# Patient Record
Sex: Female | Born: 1980 | Race: Black or African American | Hispanic: No | Marital: Married | Smoking: Never smoker
Health system: Southern US, Community
[De-identification: ages and names within clinical notes are randomized; demographics above are authoritative.]

## PROBLEM LIST (undated history)

## (undated) ENCOUNTER — Inpatient Hospital Stay (HOSPITAL_COMMUNITY): Payer: Self-pay

## (undated) DIAGNOSIS — R87619 Unspecified abnormal cytological findings in specimens from cervix uteri: Secondary | ICD-10-CM

## (undated) DIAGNOSIS — I10 Essential (primary) hypertension: Secondary | ICD-10-CM

## (undated) DIAGNOSIS — G43909 Migraine, unspecified, not intractable, without status migrainosus: Secondary | ICD-10-CM

## (undated) DIAGNOSIS — A749 Chlamydial infection, unspecified: Secondary | ICD-10-CM

## (undated) DIAGNOSIS — D219 Benign neoplasm of connective and other soft tissue, unspecified: Secondary | ICD-10-CM

## (undated) DIAGNOSIS — IMO0002 Reserved for concepts with insufficient information to code with codable children: Secondary | ICD-10-CM

## (undated) HISTORY — PX: DILATION AND CURETTAGE OF UTERUS: SHX78

## (undated) HISTORY — PX: DILATION AND CURETTAGE, DIAGNOSTIC / THERAPEUTIC: SUR384

---
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
## 2001-02-03 ENCOUNTER — Emergency Department (HOSPITAL_COMMUNITY): Admission: EM | Admit: 2001-02-03 | Discharge: 2001-02-03 | Payer: Self-pay | Admitting: Emergency Medicine

## 2001-02-18 ENCOUNTER — Emergency Department (HOSPITAL_COMMUNITY): Admission: EM | Admit: 2001-02-18 | Discharge: 2001-02-18 | Payer: Self-pay | Admitting: Emergency Medicine

## 2002-01-21 ENCOUNTER — Emergency Department (HOSPITAL_COMMUNITY): Admission: EM | Admit: 2002-01-21 | Discharge: 2002-01-21 | Payer: Self-pay | Admitting: Emergency Medicine

## 2002-10-08 ENCOUNTER — Encounter: Payer: Self-pay | Admitting: Emergency Medicine

## 2002-10-08 ENCOUNTER — Emergency Department (HOSPITAL_COMMUNITY): Admission: EM | Admit: 2002-10-08 | Discharge: 2002-10-08 | Payer: Self-pay | Admitting: Emergency Medicine

## 2003-02-02 ENCOUNTER — Inpatient Hospital Stay (HOSPITAL_COMMUNITY): Admission: AD | Admit: 2003-02-02 | Discharge: 2003-02-03 | Payer: Self-pay | Admitting: Obstetrics & Gynecology

## 2003-03-19 ENCOUNTER — Emergency Department (HOSPITAL_COMMUNITY): Admission: EM | Admit: 2003-03-19 | Discharge: 2003-03-19 | Payer: Self-pay | Admitting: Family Medicine

## 2003-05-26 ENCOUNTER — Ambulatory Visit (HOSPITAL_COMMUNITY): Admission: RE | Admit: 2003-05-26 | Discharge: 2003-05-26 | Payer: Self-pay | Admitting: Obstetrics and Gynecology

## 2003-11-06 ENCOUNTER — Emergency Department (HOSPITAL_COMMUNITY): Admission: EM | Admit: 2003-11-06 | Discharge: 2003-11-06 | Payer: Self-pay | Admitting: Emergency Medicine

## 2003-11-17 ENCOUNTER — Emergency Department (HOSPITAL_COMMUNITY): Admission: EM | Admit: 2003-11-17 | Discharge: 2003-11-18 | Payer: Self-pay | Admitting: Emergency Medicine

## 2003-12-07 ENCOUNTER — Emergency Department (HOSPITAL_COMMUNITY): Admission: EM | Admit: 2003-12-07 | Discharge: 2003-12-07 | Payer: Self-pay | Admitting: Emergency Medicine

## 2004-03-31 ENCOUNTER — Emergency Department (HOSPITAL_COMMUNITY): Admission: EM | Admit: 2004-03-31 | Discharge: 2004-03-31 | Payer: Self-pay | Admitting: Family Medicine

## 2004-04-03 ENCOUNTER — Inpatient Hospital Stay (HOSPITAL_COMMUNITY): Admission: AD | Admit: 2004-04-03 | Discharge: 2004-04-03 | Payer: Self-pay | Admitting: *Deleted

## 2004-04-09 ENCOUNTER — Inpatient Hospital Stay (HOSPITAL_COMMUNITY): Admission: AD | Admit: 2004-04-09 | Discharge: 2004-04-09 | Payer: Self-pay | Admitting: Obstetrics and Gynecology

## 2004-04-19 ENCOUNTER — Emergency Department (HOSPITAL_COMMUNITY): Admission: EM | Admit: 2004-04-19 | Discharge: 2004-04-19 | Payer: Self-pay | Admitting: *Deleted

## 2004-06-24 ENCOUNTER — Ambulatory Visit (HOSPITAL_COMMUNITY): Admission: RE | Admit: 2004-06-24 | Discharge: 2004-06-24 | Payer: Self-pay | Admitting: Obstetrics and Gynecology

## 2004-07-27 ENCOUNTER — Inpatient Hospital Stay (HOSPITAL_COMMUNITY): Admission: AD | Admit: 2004-07-27 | Discharge: 2004-07-28 | Payer: Self-pay | Admitting: Family Medicine

## 2004-09-06 ENCOUNTER — Inpatient Hospital Stay (HOSPITAL_COMMUNITY): Admission: AD | Admit: 2004-09-06 | Discharge: 2004-09-06 | Payer: Self-pay | Admitting: *Deleted

## 2004-09-06 ENCOUNTER — Ambulatory Visit: Payer: Self-pay | Admitting: *Deleted

## 2004-10-30 ENCOUNTER — Ambulatory Visit: Payer: Self-pay | Admitting: *Deleted

## 2004-10-30 ENCOUNTER — Inpatient Hospital Stay (HOSPITAL_COMMUNITY): Admission: AD | Admit: 2004-10-30 | Discharge: 2004-10-30 | Payer: Self-pay | Admitting: Family Medicine

## 2005-02-22 ENCOUNTER — Inpatient Hospital Stay (HOSPITAL_COMMUNITY): Admission: AD | Admit: 2005-02-22 | Discharge: 2005-02-22 | Payer: Self-pay | Admitting: Obstetrics and Gynecology

## 2005-03-30 ENCOUNTER — Emergency Department (HOSPITAL_COMMUNITY): Admission: EM | Admit: 2005-03-30 | Discharge: 2005-03-30 | Payer: Self-pay | Admitting: Family Medicine

## 2005-05-19 ENCOUNTER — Emergency Department (HOSPITAL_COMMUNITY): Admission: EM | Admit: 2005-05-19 | Discharge: 2005-05-19 | Payer: Self-pay | Admitting: Family Medicine

## 2005-06-08 ENCOUNTER — Inpatient Hospital Stay (HOSPITAL_COMMUNITY): Admission: AD | Admit: 2005-06-08 | Discharge: 2005-06-09 | Payer: Self-pay | Admitting: Obstetrics & Gynecology

## 2005-07-03 ENCOUNTER — Emergency Department (HOSPITAL_COMMUNITY): Admission: EM | Admit: 2005-07-03 | Discharge: 2005-07-03 | Payer: Self-pay | Admitting: Family Medicine

## 2005-07-30 ENCOUNTER — Inpatient Hospital Stay (HOSPITAL_COMMUNITY): Admission: AD | Admit: 2005-07-30 | Discharge: 2005-07-30 | Payer: Self-pay | Admitting: Gynecology

## 2005-08-17 ENCOUNTER — Inpatient Hospital Stay (HOSPITAL_COMMUNITY): Admission: AD | Admit: 2005-08-17 | Discharge: 2005-08-18 | Payer: Self-pay | Admitting: Obstetrics & Gynecology

## 2005-09-16 ENCOUNTER — Inpatient Hospital Stay (HOSPITAL_COMMUNITY): Admission: AD | Admit: 2005-09-16 | Discharge: 2005-09-16 | Payer: Self-pay | Admitting: Obstetrics & Gynecology

## 2005-09-18 ENCOUNTER — Inpatient Hospital Stay (HOSPITAL_COMMUNITY): Admission: AD | Admit: 2005-09-18 | Discharge: 2005-09-18 | Payer: Self-pay | Admitting: Gynecology

## 2005-09-25 ENCOUNTER — Inpatient Hospital Stay (HOSPITAL_COMMUNITY): Admission: AD | Admit: 2005-09-25 | Discharge: 2005-09-25 | Payer: Self-pay | Admitting: Gynecology

## 2005-10-07 ENCOUNTER — Inpatient Hospital Stay (HOSPITAL_COMMUNITY): Admission: AD | Admit: 2005-10-07 | Discharge: 2005-10-07 | Payer: Self-pay | Admitting: Obstetrics & Gynecology

## 2005-11-22 ENCOUNTER — Inpatient Hospital Stay (HOSPITAL_COMMUNITY): Admission: AD | Admit: 2005-11-22 | Discharge: 2005-11-22 | Payer: Self-pay | Admitting: Gynecology

## 2005-12-04 ENCOUNTER — Inpatient Hospital Stay (HOSPITAL_COMMUNITY): Admission: AD | Admit: 2005-12-04 | Discharge: 2005-12-04 | Payer: Self-pay | Admitting: Obstetrics

## 2005-12-05 ENCOUNTER — Ambulatory Visit (HOSPITAL_COMMUNITY): Admission: RE | Admit: 2005-12-05 | Discharge: 2005-12-05 | Payer: Self-pay | Admitting: Obstetrics and Gynecology

## 2006-01-04 ENCOUNTER — Inpatient Hospital Stay (HOSPITAL_COMMUNITY): Admission: AD | Admit: 2006-01-04 | Discharge: 2006-01-04 | Payer: Self-pay | Admitting: Obstetrics

## 2006-01-31 ENCOUNTER — Inpatient Hospital Stay (HOSPITAL_COMMUNITY): Admission: AD | Admit: 2006-01-31 | Discharge: 2006-01-31 | Payer: Self-pay | Admitting: Obstetrics

## 2006-02-11 ENCOUNTER — Inpatient Hospital Stay (HOSPITAL_COMMUNITY): Admission: AD | Admit: 2006-02-11 | Discharge: 2006-02-11 | Payer: Self-pay | Admitting: Obstetrics

## 2006-02-11 ENCOUNTER — Inpatient Hospital Stay (HOSPITAL_COMMUNITY): Admission: AD | Admit: 2006-02-11 | Discharge: 2006-02-14 | Payer: Self-pay | Admitting: Obstetrics

## 2006-05-01 ENCOUNTER — Ambulatory Visit (HOSPITAL_COMMUNITY): Admission: RE | Admit: 2006-05-01 | Discharge: 2006-05-01 | Payer: Self-pay | Admitting: Obstetrics

## 2006-06-21 ENCOUNTER — Inpatient Hospital Stay (HOSPITAL_COMMUNITY): Admission: AD | Admit: 2006-06-21 | Discharge: 2006-06-21 | Payer: Self-pay | Admitting: Obstetrics

## 2006-07-04 ENCOUNTER — Emergency Department (HOSPITAL_COMMUNITY): Admission: EM | Admit: 2006-07-04 | Discharge: 2006-07-04 | Payer: Self-pay | Admitting: Family Medicine

## 2006-11-14 ENCOUNTER — Emergency Department (HOSPITAL_COMMUNITY): Admission: EM | Admit: 2006-11-14 | Discharge: 2006-11-14 | Payer: Self-pay | Admitting: Emergency Medicine

## 2006-11-14 ENCOUNTER — Emergency Department (HOSPITAL_COMMUNITY): Admission: EM | Admit: 2006-11-14 | Discharge: 2006-11-14 | Payer: Self-pay | Admitting: Family Medicine

## 2006-12-13 ENCOUNTER — Inpatient Hospital Stay (HOSPITAL_COMMUNITY): Admission: AD | Admit: 2006-12-13 | Discharge: 2006-12-13 | Payer: Self-pay | Admitting: Obstetrics

## 2007-02-04 ENCOUNTER — Emergency Department (HOSPITAL_COMMUNITY): Admission: EM | Admit: 2007-02-04 | Discharge: 2007-02-04 | Payer: Self-pay | Admitting: Family Medicine

## 2007-04-25 ENCOUNTER — Emergency Department (HOSPITAL_COMMUNITY): Admission: EM | Admit: 2007-04-25 | Discharge: 2007-04-25 | Payer: Self-pay | Admitting: Family Medicine

## 2007-06-12 ENCOUNTER — Inpatient Hospital Stay (HOSPITAL_COMMUNITY): Admission: AD | Admit: 2007-06-12 | Discharge: 2007-06-12 | Payer: Self-pay | Admitting: Obstetrics & Gynecology

## 2007-07-16 ENCOUNTER — Inpatient Hospital Stay (HOSPITAL_COMMUNITY): Admission: AD | Admit: 2007-07-16 | Discharge: 2007-07-16 | Payer: Self-pay | Admitting: Obstetrics & Gynecology

## 2007-07-26 ENCOUNTER — Ambulatory Visit (HOSPITAL_COMMUNITY): Admission: RE | Admit: 2007-07-26 | Discharge: 2007-07-26 | Payer: Self-pay | Admitting: Obstetrics & Gynecology

## 2007-09-21 ENCOUNTER — Inpatient Hospital Stay (HOSPITAL_COMMUNITY): Admission: AD | Admit: 2007-09-21 | Discharge: 2007-09-21 | Payer: Self-pay | Admitting: Obstetrics & Gynecology

## 2007-10-30 ENCOUNTER — Ambulatory Visit (HOSPITAL_COMMUNITY): Admission: RE | Admit: 2007-10-30 | Discharge: 2007-10-30 | Payer: Self-pay | Admitting: Obstetrics & Gynecology

## 2007-11-19 ENCOUNTER — Inpatient Hospital Stay (HOSPITAL_COMMUNITY): Admission: AD | Admit: 2007-11-19 | Discharge: 2007-11-19 | Payer: Self-pay | Admitting: Obstetrics

## 2007-12-17 ENCOUNTER — Inpatient Hospital Stay (HOSPITAL_COMMUNITY): Admission: AD | Admit: 2007-12-17 | Discharge: 2007-12-17 | Payer: Self-pay | Admitting: Obstetrics & Gynecology

## 2007-12-27 ENCOUNTER — Inpatient Hospital Stay (HOSPITAL_COMMUNITY): Admission: AD | Admit: 2007-12-27 | Discharge: 2007-12-28 | Payer: Self-pay | Admitting: Obstetrics & Gynecology

## 2007-12-29 ENCOUNTER — Inpatient Hospital Stay (HOSPITAL_COMMUNITY): Admission: AD | Admit: 2007-12-29 | Discharge: 2007-12-29 | Payer: Self-pay | Admitting: Obstetrics

## 2008-01-02 ENCOUNTER — Emergency Department (HOSPITAL_COMMUNITY): Admission: EM | Admit: 2008-01-02 | Discharge: 2008-01-02 | Payer: Self-pay | Admitting: Family Medicine

## 2008-01-03 ENCOUNTER — Inpatient Hospital Stay (HOSPITAL_COMMUNITY): Admission: AD | Admit: 2008-01-03 | Discharge: 2008-01-06 | Payer: Self-pay | Admitting: Obstetrics & Gynecology

## 2008-04-13 ENCOUNTER — Emergency Department (HOSPITAL_COMMUNITY): Admission: EM | Admit: 2008-04-13 | Discharge: 2008-04-13 | Payer: Self-pay | Admitting: Emergency Medicine

## 2008-07-02 ENCOUNTER — Emergency Department (HOSPITAL_COMMUNITY): Admission: EM | Admit: 2008-07-02 | Discharge: 2008-07-02 | Payer: Self-pay | Admitting: Emergency Medicine

## 2009-04-04 ENCOUNTER — Emergency Department (HOSPITAL_COMMUNITY): Admission: EM | Admit: 2009-04-04 | Discharge: 2009-04-04 | Payer: Self-pay | Admitting: Emergency Medicine

## 2009-05-27 ENCOUNTER — Emergency Department (HOSPITAL_COMMUNITY)
Admission: EM | Admit: 2009-05-27 | Discharge: 2009-05-27 | Payer: Self-pay | Source: Home / Self Care | Admitting: Emergency Medicine

## 2009-06-04 ENCOUNTER — Emergency Department (HOSPITAL_COMMUNITY)
Admission: EM | Admit: 2009-06-04 | Discharge: 2009-06-04 | Payer: Self-pay | Source: Home / Self Care | Admitting: Emergency Medicine

## 2009-11-01 ENCOUNTER — Emergency Department (HOSPITAL_COMMUNITY): Admission: EM | Admit: 2009-11-01 | Discharge: 2009-11-01 | Payer: Self-pay | Admitting: Emergency Medicine

## 2009-12-21 ENCOUNTER — Inpatient Hospital Stay (HOSPITAL_COMMUNITY): Admission: AD | Admit: 2009-12-21 | Discharge: 2009-12-21 | Payer: Self-pay | Admitting: Obstetrics & Gynecology

## 2010-01-26 LAB — HEPATITIS B SURFACE ANTIGEN: Hepatitis B Surface Ag: NEGATIVE

## 2010-01-26 LAB — RUBELLA ANTIBODY, IGM: Rubella: IMMUNE

## 2010-01-26 LAB — ABO/RH: RH Type: POSITIVE

## 2010-01-30 NOTE — L&D Delivery Note (Signed)
Delivery Note  At 2:39 PM a viable female Savannah Thomas) was delivered via Vaginal, Spontaneous Delivery (Presentation:OA ; ). APGAR:7,9 ; weight 7 lb 11.1 oz (3490 g).  Placenta status: Intact, Spontaneous. Cord: 3 vessels with the following complications: None.   A tight nuchal cord was cut while the fetal head was on the perineum. The infant was given to the Peds team. Cord blood was collected in a sterile fashion for the Cord Blood Registry. The placenta was sent to pathology. No perineal or vaginal lacerations were noted.  Anesthesia: Epidural  Episiotomy: None  Lacerations: None  Est. Blood Loss (mL): 350  Mom to postpartum. Baby to nursery-stable.  Janine Limbo  08/19/2010, 3:23 PM

## 2010-02-16 LAB — STREP B DNA PROBE: GBS: POSITIVE

## 2010-02-20 ENCOUNTER — Encounter: Payer: Self-pay | Admitting: Obstetrics

## 2010-03-16 ENCOUNTER — Encounter (HOSPITAL_COMMUNITY): Payer: Self-pay | Admitting: Radiology

## 2010-03-16 ENCOUNTER — Inpatient Hospital Stay (HOSPITAL_COMMUNITY): Payer: BC Managed Care – PPO

## 2010-03-16 ENCOUNTER — Inpatient Hospital Stay (HOSPITAL_COMMUNITY)
Admission: AD | Admit: 2010-03-16 | Discharge: 2010-03-16 | Disposition: A | Discharge status: Home / Self Care | Payer: BC Managed Care – PPO | Source: Ambulatory Visit | Attending: Obstetrics and Gynecology | Admitting: Obstetrics and Gynecology

## 2010-03-16 DIAGNOSIS — O99891 Other specified diseases and conditions complicating pregnancy: Secondary | ICD-10-CM | POA: Insufficient documentation

## 2010-03-16 DIAGNOSIS — R51 Headache: Secondary | ICD-10-CM | POA: Insufficient documentation

## 2010-03-16 DIAGNOSIS — R296 Repeated falls: Secondary | ICD-10-CM | POA: Insufficient documentation

## 2010-03-16 DIAGNOSIS — R42 Dizziness and giddiness: Secondary | ICD-10-CM | POA: Insufficient documentation

## 2010-03-16 LAB — WET PREP, GENITAL
Clue Cells Wet Prep HPF POC: NONE SEEN
Yeast Wet Prep HPF POC: NONE SEEN

## 2010-04-12 LAB — CBC
HCT: 34.3 % — ABNORMAL LOW (ref 36.0–46.0)
Hemoglobin: 11.2 g/dL — ABNORMAL LOW (ref 12.0–15.0)
MCH: 26.6 pg (ref 26.0–34.0)
MCHC: 32.7 g/dL (ref 30.0–36.0)

## 2010-04-12 LAB — URINALYSIS, ROUTINE W REFLEX MICROSCOPIC
Bilirubin Urine: NEGATIVE
Ketones, ur: NEGATIVE mg/dL
Nitrite: NEGATIVE
Specific Gravity, Urine: 1.02 (ref 1.005–1.030)

## 2010-04-12 LAB — HCG, QUANTITATIVE, PREGNANCY: hCG, Beta Chain, Quant, S: 5417 m[IU]/mL — ABNORMAL HIGH (ref <5)

## 2010-04-12 LAB — WET PREP, GENITAL: Trich, Wet Prep: NONE SEEN

## 2010-04-12 LAB — GC/CHLAMYDIA PROBE AMP, GENITAL: Chlamydia, DNA Probe: NEGATIVE

## 2010-04-19 LAB — POCT URINALYSIS DIP (DEVICE)
Hgb urine dipstick: NEGATIVE
Nitrite: NEGATIVE
Protein, ur: NEGATIVE mg/dL
Urobilinogen, UA: 0.2 mg/dL (ref 0.0–1.0)
pH: 7 (ref 5.0–8.0)

## 2010-04-19 LAB — POCT I-STAT, CHEM 8
BUN: 10 mg/dL (ref 6–23)
Calcium, Ion: 1.14 mmol/L (ref 1.12–1.32)
Creatinine, Ser: 0.8 mg/dL (ref 0.4–1.2)
Glucose, Bld: 91 mg/dL (ref 70–99)
HCT: 41 % (ref 36.0–46.0)
Hemoglobin: 13.9 g/dL (ref 12.0–15.0)
Sodium: 138 mEq/L (ref 135–145)

## 2010-06-14 NOTE — H&P (Signed)
NAME:  Savannah Thomas, Savannah Thomas NO.:  [id number]   MEDICAL RECORD NO.:  [id number]          PATIENT TYPE:  INP   LOCATION:  9166                          FACILITY:  [facility]   PHYSICIAN:  Roseanna Rainbow, M.D.DATE OF BIRTH:  02-12-1980   DATE OF ADMISSION:  01/03/2008  DATE OF DISCHARGE:                              HISTORY & PHYSICAL   CHIEF COMPLAINT:  The patient is a 30 year old para 2 with an estimated  date of confinement of January 06, 2008, with an intrauterine pregnancy  at 39+ weeks complaining of contractions.   HISTORY OF PRESENT ILLNESS:  Please see the above.   ALLERGIES:  No known drug allergies.   MEDICATIONS:  Prenatal vitamins.   OBSTETRICAL RISK FACTORS:  She has a history of a preterm delivery and a  previous cesarean section; however, she has had a VBAC.   PRENATAL LABORATORY DATA:  Urine culture and sensitivity insignificant  growth.  Hepatitis B surface antigen negative.  Hematocrit 33.2,  hemoglobin 10.4.  HIV nonreactive.  Quad screen negative.  Platelets  261,000.  Blood type is measured A+.  Antibody screen negative.  RPR  nonreactive.  Rubella immune.  Sickle cell negative.   PAST OBSTETRICAL HISTORY:  There is a history of a spontaneous abortion.  In October 2006, she was delivered of a live born female full term, 8  pounds 15 ounces via cesarean delivery.  In January 2008, she was  delivered of a live born female at 35 weeks, 5 pounds 5 ounces, vaginal  delivery.   PAST GYNECOLOGICAL HISTORY:  Noncontributory.   PAST MEDICAL HISTORY:  1. Asthma.  2. Tension headaches.   PAST SURGICAL HISTORY:  She denies.   SOCIAL HISTORY:  She is a homemaker, single, living with the significant  other.  Has no significant smoking history.   FAMILY HISTORY:  Remarkable for adult-onset diabetes and hypertension.   PHYSICAL EXAM:  Vital signs stable, afebrile.  Fetal heart tracing  reassuring.  Tocodynamometer, uterine contractions every 2-4  minutes.  Sterile vaginal exam per the RN: Cervix is 5 cm dilated.   ASSESSMENT:  1. Multipara at term.  2. History of a previous cesarean delivery and successful vaginal      birth after cesarean.  3. Late latent versus early active labor.  4. Fetal heart tracing consistent with fetal well being.   PLAN:  Admission.  Expectant management.      Roseanna Rainbow, M.D.  Electronically Signed     LAJ/MEDQ  D:  01/04/2008  T:  01/04/2008  Job:  [id number]

## 2010-08-08 ENCOUNTER — Inpatient Hospital Stay (HOSPITAL_COMMUNITY)
Admission: AD | Admit: 2010-08-08 | Discharge: 2010-08-08 | Disposition: A | Discharge status: Home / Self Care | Payer: Medicaid Other | Source: Ambulatory Visit | Attending: Obstetrics and Gynecology | Admitting: Obstetrics and Gynecology

## 2010-08-08 ENCOUNTER — Encounter (HOSPITAL_COMMUNITY): Payer: Self-pay

## 2010-08-08 ENCOUNTER — Inpatient Hospital Stay (HOSPITAL_COMMUNITY)
Admission: AD | Admit: 2010-08-08 | Payer: BC Managed Care – PPO | Source: Ambulatory Visit | Admitting: Obstetrics and Gynecology

## 2010-08-08 DIAGNOSIS — O133 Gestational [pregnancy-induced] hypertension without significant proteinuria, third trimester: Secondary | ICD-10-CM

## 2010-08-08 DIAGNOSIS — Z3490 Encounter for supervision of normal pregnancy, unspecified, unspecified trimester: Secondary | ICD-10-CM

## 2010-08-08 DIAGNOSIS — O1200 Gestational edema, unspecified trimester: Secondary | ICD-10-CM | POA: Diagnosis present

## 2010-08-08 DIAGNOSIS — O139 Gestational [pregnancy-induced] hypertension without significant proteinuria, unspecified trimester: Secondary | ICD-10-CM | POA: Insufficient documentation

## 2010-08-08 HISTORY — DX: Chlamydial infection, unspecified: A74.9

## 2010-08-08 HISTORY — DX: Benign neoplasm of connective and other soft tissue, unspecified: D21.9

## 2010-08-08 HISTORY — DX: Migraine, unspecified, not intractable, without status migrainosus: G43.909

## 2010-08-08 HISTORY — DX: Unspecified abnormal cytological findings in specimens from cervix uteri: R87.619

## 2010-08-08 HISTORY — DX: Reserved for concepts with insufficient information to code with codable children: IMO0002

## 2010-08-08 LAB — COMPREHENSIVE METABOLIC PANEL
ALT: 6 U/L (ref 0–35)
AST: 19 U/L (ref 0–37)
Alkaline Phosphatase: 114 U/L (ref 39–117)
CO2: 22 mEq/L (ref 19–32)
Chloride: 105 mEq/L (ref 96–112)
Creatinine, Ser: 0.48 mg/dL — ABNORMAL LOW (ref 0.50–1.10)
GFR calc non Af Amer: >60 mL/min (ref >60)
Total Bilirubin: 0.1 mg/dL — ABNORMAL LOW (ref 0.3–1.2)

## 2010-08-08 LAB — CBC
MCV: 78.7 fL (ref 78.0–100.0)
Platelets: 205 10*3/uL (ref 150–400)
RBC: 4.09 MIL/uL (ref 3.87–5.11)
WBC: 9.9 10*3/uL (ref 4.0–10.5)

## 2010-08-08 LAB — URINALYSIS, ROUTINE W REFLEX MICROSCOPIC
Bilirubin Urine: NEGATIVE
Glucose, UA: 100 mg/dL — AB
Hgb urine dipstick: NEGATIVE
Ketones, ur: NEGATIVE mg/dL
pH: 6.5 (ref 5.0–8.0)

## 2010-08-08 LAB — URIC ACID: Uric Acid, Serum: 4.9 mg/dL (ref 2.4–7.0)

## 2010-08-08 MED ORDER — ACETAMINOPHEN 325 MG PO TABS
650.0000 mg | ORAL_TABLET | Freq: Once | ORAL | Status: AC
  Administered 2010-08-08: 650 mg via ORAL

## 2010-08-08 NOTE — ED Provider Notes (Signed)
History     Chief Complaint  Patient presents with  . Leg Swelling    Other This is a new problem. The current episode started in the past 7 days. The problem occurs constantly. The problem has been unchanged. Associated symptoms include headaches (Resolved spontaneously). Pertinent negatives include no abdominal pain (Denies contractions, vaginal bleeding or leaking of fluid). Visual change: blurry vision since this morning. The symptoms are aggravated by nothing. She has tried nothing for the symptoms.   The patient is a 30 year-old black female at 37.[redacted] weeks gestation.   OB History    Grav Para Term Preterm Abortions TAB SAB Ect Mult Living   7 3 2 1 3 2 1   3       Past Medical History  Diagnosis Date  . Gestational diabetes   . Abnormal Pap smear   . Preterm labor   . Fibroids   . Chlamydia   . Candidiasis   . Asthma   . Migraines     Past Surgical History  Procedure Date  . Dilation and curettage, diagnostic / therapeutic     TAB  . Dilation and curettage of uterus   . Cesarean section     Family History  Problem Relation Age of Onset  . Hypertension Mother   . Hypertension Father   . Cancer Maternal Grandmother   . Hypertension Maternal Grandmother   . Hypertension Maternal Grandfather   . Hypertension Paternal Grandmother   . Heart disease Paternal Grandfather   . Hypertension Paternal Grandfather   . Heart disease Cousin     History  Substance Use Topics  . Smoking status: Never Smoker   . Smokeless tobacco: Not on file  . Alcohol Use: No    Allergies: No Known Allergies  No prescriptions prior to admission    Review of Systems  Constitutional: Negative.   Eyes: Positive for blurred vision.  Cardiovascular: Positive for leg swelling.  Gastrointestinal: Negative for abdominal pain (Denies contractions, vaginal bleeding or leaking of fluid).       Denies epigastric pain.  Neurological: Positive for headaches (Resolved spontaneously).    Physical Exam   Blood pressure 116/81, pulse 88, temperature 98.9 F (37.2 C), temperature source Oral, resp. rate 18, height 5\' 6"  (1.676 m), weight 133.902 kg (295 lb 3.2 oz), SpO2 95.00%.  BP's: 115-144/68-91 (147/95) SVE: 0/50/-3 FHR: 140's , category 1 Toco: irreg, mild Results for orders placed during the hospital encounter of 08/08/10 (from the past 24 hour(s))  CBC     Status: Abnormal   Collection Time   08/08/10  6:18 PM      Component Value Range   WBC 9.9  4.0 - 10.5 (K/uL)   RBC 4.09  3.87 - 5.11 (MIL/uL)   Hemoglobin 10.5 (*) 12.0 - 15.0 (g/dL)   HCT 16.1 (*) 09.6 - 46.0 (%)   MCV 78.7  78.0 - 100.0 (fL)   MCH 25.7 (*) 26.0 - 34.0 (pg)   MCHC 32.6  30.0 - 36.0 (g/dL)   RDW 04.5  40.9 - 81.1 (%)   Platelets 205  150 - 400 (K/uL)      *Note: This is not all of the results for the requested time period. They were limited due to a high amount of results. Please view the rest in Results Review.   AST: 19 ALT: 6 Uric Acid: 4.9 LDH: 171 Urine: neg protein     Physical Exam  Constitutional: She appears well-developed and well-nourished.  GI:  Soft. There is no tenderness.  Genitourinary: No bleeding around the vagina.       No leaking of fluid.  Neurological: She has normal reflexes.    MAU Course  Procedures  Assessment:  1. Gestational Hypertension 2. FHR category 1  Plan: 1. D/C home 2. 24-hour urine supplies and instructions given. Bring to next appointment 08/10/10 at [facility] 3. NST per consult with Dr. Normand Sloop. 4. PIH and labor precautions. FKCs.

## 2010-08-08 NOTE — Progress Notes (Signed)
Pt states she started having increased swelling in feet 7-7, this am started having a headache and visual changes. Some irreg mild contractions

## 2010-08-08 NOTE — Initial Assessments (Signed)
Patient is here with c/o bilateral leg swelling, headache. She denies any visual disturbance, vaginal bleeding, lof or discharge. She reports good fetal movement. 2+ edema in her lower extremities.

## 2010-08-16 ENCOUNTER — Encounter (HOSPITAL_COMMUNITY): Payer: Self-pay | Admitting: *Deleted

## 2010-08-16 ENCOUNTER — Inpatient Hospital Stay (HOSPITAL_COMMUNITY)
Admission: AD | Admit: 2010-08-16 | Discharge: 2010-08-16 | Disposition: A | Discharge status: Home / Self Care | Payer: BC Managed Care – PPO | Source: Ambulatory Visit | Attending: Obstetrics and Gynecology | Admitting: Obstetrics and Gynecology

## 2010-08-16 ENCOUNTER — Encounter: Payer: Self-pay | Admitting: Obstetrics and Gynecology

## 2010-08-16 DIAGNOSIS — O139 Gestational [pregnancy-induced] hypertension without significant proteinuria, unspecified trimester: Secondary | ICD-10-CM

## 2010-08-16 DIAGNOSIS — Z3490 Encounter for supervision of normal pregnancy, unspecified, unspecified trimester: Secondary | ICD-10-CM

## 2010-08-16 LAB — COMPREHENSIVE METABOLIC PANEL
ALT: 7 U/L (ref 0–35)
AST: 20 U/L (ref 0–37)
Calcium: 9.1 mg/dL (ref 8.4–10.5)
Creatinine, Ser: 0.49 mg/dL — ABNORMAL LOW (ref 0.50–1.10)
GFR calc Af Amer: >60 mL/min (ref >60)
Glucose, Bld: 75 mg/dL (ref 70–99)
Sodium: 136 mEq/L (ref 135–145)
Total Protein: 7.1 g/dL (ref 6.0–8.3)

## 2010-08-16 LAB — URINALYSIS, DIPSTICK ONLY
Bilirubin Urine: NEGATIVE
Nitrite: NEGATIVE
Specific Gravity, Urine: 1.015 (ref 1.005–1.030)
Urobilinogen, UA: 0.2 mg/dL (ref 0.0–1.0)
pH: 6.5 (ref 5.0–8.0)

## 2010-08-16 LAB — URIC ACID: Uric Acid, Serum: 5 mg/dL (ref 2.4–7.0)

## 2010-08-16 LAB — CBC
MCH: 25.1 pg — ABNORMAL LOW (ref 26.0–34.0)
MCHC: 31.8 g/dL (ref 30.0–36.0)
MCV: 78.7 fL (ref 78.0–100.0)
Platelets: 227 10*3/uL (ref 150–400)

## 2010-08-16 MED ORDER — LABETALOL HCL 100 MG PO TABS
400.0000 mg | ORAL_TABLET | Freq: Once | ORAL | Status: AC
  Administered 2010-08-16: 400 mg via ORAL
  Filled 2010-08-16: qty 4

## 2010-08-16 MED ORDER — LABETALOL HCL 200 MG PO TABS: 400.0000 mg | ORAL_TABLET | Freq: Two times a day (BID) | ORAL | Status: DC

## 2010-08-16 MED ORDER — LABETALOL HCL 200 MG PO TABS: 400.0000 mg | ORAL_TABLET | Freq: Once | ORAL | Status: DC

## 2010-08-16 NOTE — ED Notes (Signed)
Savannah Thomas CNM in with pt. Cervix checked.

## 2010-08-16 NOTE — ED Notes (Signed)
Ice chips given

## 2010-08-16 NOTE — Progress Notes (Signed)
Pt reports having elevated BP 150/90 at office today, and was sent over for [facility] workup."

## 2010-08-16 NOTE — Progress Notes (Signed)
A user error has taken place: encounter opened in error, closed for administrative reasons.            This encounter was created in error - please disregard.

## 2010-08-16 NOTE — ED Provider Notes (Signed)
History   Patient is a 30 yo [id number] at 70 5/7 weeks who presented for evaluation due to BP 160s/90 in office today.  Had PIH w/u 2 weeks ago with normal labs, and a 24 hour urine of 105 last week.  BP has been mildly elevated over last 2 weeks. Patient denies HA, visual symptoms, or epigastric pain.  Has had mild swelling.  No history of elevated BP in previous pregnancies or between, and this pregnancy is with same partner.  Chief Complaint  Patient presents with  . Hypertension   Hypertension      Past Medical History  Diagnosis Date  . Abnormal Pap smear   . Preterm labor   . Fibroids   . Chlamydia   . Candidiasis   . Asthma   . Migraines   . Gestational diabetes     Past Surgical History  Procedure Date  . Dilation and curettage, diagnostic / therapeutic     TAB  . Dilation and curettage of uterus   . Cesarean section   . No past surgeries     Family History  Problem Relation Age of Onset  . Hypertension Mother   . Hypertension Father   . Cancer Maternal Grandmother   . Hypertension Maternal Grandmother   . Hypertension Maternal Grandfather   . Hypertension Paternal Grandmother   . Heart disease Paternal Grandfather   . Hypertension Paternal Grandfather   . Heart disease Cousin     History  Substance Use Topics  . Smoking status: Never Smoker   . Smokeless tobacco: Not on file  . Alcohol Use: No    Allergies: No Known Allergies  Prescriptions prior to admission  Medication Sig Dispense Refill  . prenatal vitamin w/FE, FA (PRENATAL 1 + 1) 27-1 MG TABS Take 1 tablet by mouth daily.          Review of Systems  Constitutional: Negative.   HENT: Negative.   Eyes: Negative.   Respiratory: Negative.   Cardiovascular: Negative.   Gastrointestinal: Negative.   Genitourinary: Negative.   Musculoskeletal: Negative.   Skin: Negative.   Neurological: Negative.   Endo/Heme/Allergies: Negative.   Psychiatric/Behavioral: Negative.    Physical Exam    Blood pressure 145/90, pulse 69, temperature 99.1 F (37.3 C), temperature source Oral, height 5\' 7"  (1.702 m), weight 133.358 kg (294 lb), SpO2 99.00%. BP initially 164/96, then 137-145/87-89 during MAU stay.   FHR reactive, U/C q4-35min, mild.  Cervix closed, 50%, vertex -2, but ballotable.  Physical Exam  Constitutional: She appears well-developed and well-nourished.  HENT:  Head: Normocephalic and atraumatic.  Eyes: Pupils are equal, round, and reactive to light.  Neck: Normal range of motion.  Cardiovascular: Normal rate.   Respiratory: Effort normal.  GI: Soft.  Genitourinary: Vagina normal.  Musculoskeletal: Edema: 1-2+  Neurological: She has normal reflexes.  Skin: Skin is warm.  Psychiatric: She has a normal mood and affect.      Results for orders placed during the hospital encounter of 08/16/10 (from the past 24 hour(s))  URINALYSIS, DIPSTICK ONLY     Status: Abnormal   Collection Time   08/16/10  5:00 PM      Component Value Range   Specific Gravity, Urine 1.015  1.005 - 1.030    pH 6.5  5.0 - 8.0    Glucose, UA NEGATIVE  NEGATIVE (mg/dL)   Hgb urine dipstick NEGATIVE  NEGATIVE    Bilirubin Urine NEGATIVE  NEGATIVE    Ketones, ur 15 (*) NEGATIVE (  mg/dL)   Protein, ur NEGATIVE  NEGATIVE (mg/dL)   Urobilinogen, UA 0.2  0.0 - 1.0 (mg/dL)   Nitrite NEGATIVE  NEGATIVE    Leukocytes, UA NEGATIVE  NEGATIVE   CBC     Status: Abnormal   Collection Time   08/16/10  5:42 PM      Component Value Range   WBC 10.1  4.0 - 10.5 (K/uL)   RBC 4.23  3.87 - 5.11 (MIL/uL)   Hemoglobin 10.6 (*) 12.0 - 15.0 (g/dL)   HCT 14.7 (*) 82.9 - 46.0 (%)   MCV 78.7  78.0 - 100.0 (fL)   MCH 25.1 (*) 26.0 - 34.0 (pg)   MCHC 31.8  30.0 - 36.0 (g/dL)   RDW 56.2  13.0 - 86.5 (%)   Platelets 227  150 - 400 (K/uL)  COMPREHENSIVE METABOLIC PANEL     Status: Abnormal   Collection Time   08/16/10  5:42 PM      Component Value Range   Sodium 136  135 - 145 (mEq/L)   Potassium 3.9  3.5 - 5.1  (mEq/L)   Chloride 104  96 - 112 (mEq/L)   CO2 21  19 - 32 (mEq/L)   Glucose, Bld 75  70 - 99 (mg/dL)   BUN 5 (*) 6 - 23 (mg/dL)   Creatinine, Ser 7.84 (*) 0.50 - 1.10 (mg/dL)   Calcium 9.1  8.4 - 69.6 (mg/dL)   Total Protein 7.1  6.0 - 8.3 (g/dL)   Albumin 2.4 (*) 3.5 - 5.2 (g/dL)   AST 20  0 - 37 (U/L)   ALT 7  0 - 35 (U/L)   Alkaline Phosphatase 141 (*) 39 - 117 (U/L)   Total Bilirubin 0.2 (*) 0.3 - 1.2 (mg/dL)   GFR calc non Af Amer >60  >60 (mL/min)   GFR calc Af Amer >60  >60 (mL/min)  LACTATE DEHYDROGENASE     Status: Abnormal   Collection Time   08/16/10  5:42 PM      Component Value Range   LD 405 (*) 94 - 250 (U/L)  URIC ACID     Status: Normal   Collection Time   08/16/10  5:42 PM      Component Value Range   Uric Acid, Serum 5.0  2.4 - 7.0 (mg/dL)   Labs WNL x elevated LDH.   MAU Course  Procedures   Assessment and Plan  Reviewed with Dr. Stefano Gaul labetolol 400 mg po now and BID (Rx given) PIH precautions reviewed Patient to return to office on Thursday, 7/19, for BP check. Increase rest at home.  Jahzion Brogden L 08/16/2010, 7:09 PM

## 2010-08-16 NOTE — Progress Notes (Signed)
Pt sent from the office for evaluation of elevated BP. Pt denies pain, bleeding or leaking fluid. No headache or visual changes. Reports good fetal movement.

## 2010-08-17 NOTE — ED Provider Notes (Signed)
  Medical screening examination/treatment/procedure(s) were performed by non-physician practitioner and as supervising physician I was immediately available for consultation/collaboration.     

## 2010-08-18 NOTE — H&P (Signed)
NAME:  Savannah Thomas, Savannah Thomas NO.:  [id number]  MEDICAL RECORD NO.:  [id number]  LOCATION:  GN56                          FACILITY:  [facility]  PHYSICIAN:  Janine Limbo, M.D.DATE OF BIRTH:  1980/05/13  DATE OF ADMISSION:  08/16/2010 DATE OF DISCHARGE:  08/16/2010                             HISTORY & PHYSICAL   HISTORY OF PRESENT ILLNESS:  The patient is a 30 year old female, gravida 7, para 2-1-3-3, who presents at 39 weeks and 1 day gestation (EDC is August 25, 2010).  The patient has been followed at the [facility].  The pregnancy has been complicated by the fact that her weight is 299 pounds.  She has had a prior cesarean section.  She desires a repeat cesarean section.  She has had 2 vaginal deliveries after cesarean delivery.  The patient has pregnancy-induced hypertension.  She is currently being treated with labetalol 200 mg twice each day.  Her third trimester beta strep test was positive.  The patient also has a known history of fibroids.  She has a history of a preterm delivery.  OBSTETRICAL HISTORY:  The patient had a miscarriage in 2004.  She had a cesarean section in 2006 for failure to progress in labor.  She delivered an 8 pound 15 ounce female infant at that time.  In 2008 and again in 2009, the patient had vaginal deliveries.  In 2008 and 2011, she had an elective pregnancy termination.  DRUG ALLERGIES:  No known drug allergies.  PAST MEDICAL HISTORY:  The patient has a history of gestational diabetes with her third child.  The patient had a cesarean section in 2006.  SOCIAL HISTORY:  The patient denies cigarette use, alcohol use, and recreational drug use.  FAMILY HISTORY:  Noncontributory.  PHYSICAL EXAMINATION:  VITAL SIGNS:  Weight is 299 pounds.  Height is 5 feet 7 inches. HEENT:  Within normal limits. CHEST:  Clear. HEART:  Regular rate and rhythm. BREASTS:  Without  masses. ABDOMEN:  Gravid with fundal height of 41 cm. EXTREMITIES:  Grossly normal. NEUROLOGIC:  Grossly normal. PELVIC:  The cervix is 3 cm dilated, 80% effaced, and -1 in station.  LABORATORY VALUES:  Blood type is A+, antibody screen negative, sickle cell negative, VDRL nonreactive, rubella immune, HBsAg negative, HIV nonreactive, GC negative, Chlamydia negative.  Third trimester beta strep is negative, but the patient did have beta strep bacteriuria during her pregnancy.  ASSESSMENT: 1. A 39-week and 1-day gestation. 2. Obesity (weight 299 pounds). 3. Pregnancy-induced hypertension. 4. Favorable cervix. 5. Fibroid uterus. 6. History of beta strep bacteriuria 7. Prior cesarean section and 2 subsequent vaginal birth after     cesarean section.  PLAN:  The patient will be admitted for induction of labor.  We will start with Pitocin.  We will follow with amniotomy.  The patient understands the indications for her procedure and she accepts the risks. We will treat the patient during her labor with penicillin.     Janine Limbo, M.D.     AVS/MEDQ  D:  08/18/2010  T:  08/18/2010  Job:  [id number]

## 2010-08-19 ENCOUNTER — Encounter (HOSPITAL_COMMUNITY): Payer: Self-pay | Admitting: Anesthesiology

## 2010-08-19 ENCOUNTER — Inpatient Hospital Stay (HOSPITAL_COMMUNITY)
Admission: RE | Admit: 2010-08-19 | Discharge: 2010-08-21 | DRG: 775 | Disposition: A | Discharge status: Home / Self Care | Payer: Medicaid Other | Source: Ambulatory Visit | Attending: Obstetrics and Gynecology | Admitting: Obstetrics and Gynecology

## 2010-08-19 ENCOUNTER — Encounter (HOSPITAL_COMMUNITY): Payer: Self-pay

## 2010-08-19 ENCOUNTER — Other Ambulatory Visit: Payer: Self-pay | Admitting: Obstetrics and Gynecology

## 2010-08-19 ENCOUNTER — Inpatient Hospital Stay (HOSPITAL_COMMUNITY): Payer: Medicaid Other | Admitting: Anesthesiology

## 2010-08-19 DIAGNOSIS — O1200 Gestational edema, unspecified trimester: Secondary | ICD-10-CM

## 2010-08-19 DIAGNOSIS — O139 Gestational [pregnancy-induced] hypertension without significant proteinuria, unspecified trimester: Principal | ICD-10-CM | POA: Diagnosis present

## 2010-08-19 LAB — CBC
HCT: 31.2 % — ABNORMAL LOW (ref 36.0–46.0)
Hemoglobin: 10.1 g/dL — ABNORMAL LOW (ref 12.0–15.0)
MCHC: 32.4 g/dL (ref 30.0–36.0)
MCV: 79 fL (ref 78.0–100.0)

## 2010-08-19 MED ORDER — LANOLIN HYDROUS EX OINT: TOPICAL_OINTMENT | CUTANEOUS | Status: DC | PRN

## 2010-08-19 MED ORDER — OXYTOCIN 20 UNITS IN LACTATED RINGERS INFUSION - SIMPLE: 125.0000 mL/h | INTRAVENOUS | Status: DC | PRN

## 2010-08-19 MED ORDER — SIMETHICONE 80 MG PO CHEW: 80.0000 mg | CHEWABLE_TABLET | ORAL | Status: DC | PRN

## 2010-08-19 MED ORDER — BENZOCAINE-MENTHOL 20-0.5 % EX AERO: 1.0000 "application " | INHALATION_SPRAY | CUTANEOUS | Status: DC | PRN

## 2010-08-19 MED ORDER — OXYTOCIN 20 UNITS IN LACTATED RINGERS INFUSION - SIMPLE: 2.0000 m[IU]/min | INTRAVENOUS | Status: DC

## 2010-08-19 MED ORDER — OXYTOCIN 20 UNITS IN LACTATED RINGERS INFUSION - SIMPLE: 125.0000 mL/h | Freq: Once | INTRAVENOUS | Status: DC

## 2010-08-19 MED ORDER — LABETALOL HCL 5 MG/ML IV SOLN: 10.0000 mg/min | INTRAVENOUS | Status: DC | PRN

## 2010-08-19 MED ORDER — ONDANSETRON HCL 4 MG/2ML IJ SOLN: 4.0000 mg | INTRAMUSCULAR | Status: DC | PRN

## 2010-08-19 MED ORDER — SENNOSIDES-DOCUSATE SODIUM 8.6-50 MG PO TABS
1.0000 | ORAL_TABLET | Freq: Every day | ORAL | Status: DC
  Administered 2010-08-19: 2 via ORAL

## 2010-08-19 MED ORDER — ONDANSETRON HCL 4 MG PO TABS
4.0000 mg | ORAL_TABLET | ORAL | Status: DC | PRN
  Administered 2010-08-19: 4 mg via ORAL
  Filled 2010-08-19: qty 1

## 2010-08-19 MED ORDER — IBUPROFEN 600 MG PO TABS: 600.0000 mg | ORAL_TABLET | Freq: Four times a day (QID) | ORAL | Status: DC | PRN

## 2010-08-19 MED ORDER — FENTANYL CITRATE 0.05 MG/ML IJ SOLN
100.0000 ug | INTRAMUSCULAR | Status: DC | PRN
  Administered 2010-08-19: 100 ug via INTRAVENOUS
  Filled 2010-08-19: qty 2

## 2010-08-19 MED ORDER — CITRIC ACID-SODIUM CITRATE 334-500 MG/5ML PO SOLN
30.0000 mL | ORAL | Status: DC | PRN
  Filled 2010-08-19: qty 15

## 2010-08-19 MED ORDER — OXYTOCIN 20 UNITS IN LACTATED RINGERS INFUSION - SIMPLE: 1.0000 m[IU]/min | INTRAVENOUS | Status: DC

## 2010-08-19 MED ORDER — PRENATAL PLUS 27-1 MG PO TABS
1.0000 | ORAL_TABLET | Freq: Every day | ORAL | Status: DC
  Administered 2010-08-20 – 2010-08-21 (×2): 1 via ORAL
  Filled 2010-08-19 (×2): qty 1

## 2010-08-19 MED ORDER — PHENYLEPHRINE 40 MCG/ML (10ML) SYRINGE FOR IV PUSH (FOR BLOOD PRESSURE SUPPORT)
80.0000 ug | PREFILLED_SYRINGE | INTRAVENOUS | Status: DC | PRN
  Filled 2010-08-19: qty 5

## 2010-08-19 MED ORDER — DIPHENHYDRAMINE HCL 50 MG/ML IJ SOLN: 12.5000 mg | INTRAMUSCULAR | Status: DC | PRN

## 2010-08-19 MED ORDER — LACTATED RINGERS IR SOLN: 200.0000 mL/h | Status: DC

## 2010-08-19 MED ORDER — OXYCODONE-ACETAMINOPHEN 5-325 MG PO TABS
1.0000 | ORAL_TABLET | ORAL | Status: DC | PRN
  Administered 2010-08-20: 1 via ORAL
  Filled 2010-08-19: qty 1

## 2010-08-19 MED ORDER — ACETAMINOPHEN 325 MG PO TABS: 650.0000 mg | ORAL_TABLET | ORAL | Status: DC | PRN

## 2010-08-19 MED ORDER — PHENYLEPHRINE 40 MCG/ML (10ML) SYRINGE FOR IV PUSH (FOR BLOOD PRESSURE SUPPORT)
80.0000 ug | PREFILLED_SYRINGE | INTRAVENOUS | Status: DC | PRN
  Filled 2010-08-19 (×2): qty 5

## 2010-08-19 MED ORDER — EPHEDRINE 5 MG/ML INJ
10.0000 mg | INTRAVENOUS | Status: DC | PRN
  Filled 2010-08-19 (×2): qty 4

## 2010-08-19 MED ORDER — DIPHENHYDRAMINE HCL 25 MG PO CAPS: 25.0000 mg | ORAL_CAPSULE | Freq: Four times a day (QID) | ORAL | Status: DC | PRN

## 2010-08-19 MED ORDER — LABETALOL HCL 5 MG/ML IV SOLN
10.0000 mg | INTRAVENOUS | Status: DC | PRN
  Filled 2010-08-19: qty 8

## 2010-08-19 MED ORDER — LABETALOL HCL 5 MG/ML IV SOLN
10.0000 mg | INTRAVENOUS | Status: AC | PRN
  Administered 2010-08-19: 40 mg via INTRAVENOUS
  Administered 2010-08-19: 20 mg via INTRAVENOUS
  Administered 2010-08-19: 10 mg via INTRAVENOUS
  Filled 2010-08-19: qty 4
  Filled 2010-08-19: qty 8
  Filled 2010-08-19: qty 4

## 2010-08-19 MED ORDER — LIDOCAINE HCL (PF) 1 % IJ SOLN
30.0000 mL | INTRAMUSCULAR | Status: DC | PRN
  Filled 2010-08-19 (×2): qty 30

## 2010-08-19 MED ORDER — FENTANYL 2.5 MCG/ML BUPIVACAINE 1/10 % EPIDURAL INFUSION (WH - ANES)
14.0000 mL/h | INTRAMUSCULAR | Status: DC
  Administered 2010-08-19: 14 mL/h via EPIDURAL
  Filled 2010-08-19: qty 60

## 2010-08-19 MED ORDER — LACTATED RINGERS IV SOLN
500.0000 mL | INTRAVENOUS | Status: AC | PRN
  Administered 2010-08-19: 500 mL via INTRAVENOUS

## 2010-08-19 MED ORDER — EPHEDRINE 5 MG/ML INJ
10.0000 mg | INTRAVENOUS | Status: DC | PRN
  Filled 2010-08-19: qty 4

## 2010-08-19 MED ORDER — FENTANYL CITRATE 0.05 MG/ML IJ SOLN: 10.0000 ug/h | INTRAMUSCULAR | Status: DC

## 2010-08-19 MED ORDER — MEDROXYPROGESTERONE ACETATE 150 MG/ML IM SUSP: 150.0000 mg | INTRAMUSCULAR | Status: DC | PRN

## 2010-08-19 MED ORDER — BENZOCAINE-MENTHOL 20-0.5 % EX AERO
INHALATION_SPRAY | CUTANEOUS | Status: AC
  Filled 2010-08-19: qty 56

## 2010-08-19 MED ORDER — LACTATED RINGERS IV SOLN
500.0000 mL | Freq: Once | INTRAVENOUS | Status: AC
  Administered 2010-08-19: 1000 mL via INTRAVENOUS

## 2010-08-19 MED ORDER — NALBUPHINE SYRINGE 5 MG/0.5 ML
10.0000 mg | INJECTION | Freq: Four times a day (QID) | INTRAMUSCULAR | Status: DC | PRN
  Administered 2010-08-19: 10 mg via INTRAVENOUS
  Filled 2010-08-19 (×2): qty 1

## 2010-08-19 MED ORDER — PENICILLIN G POTASSIUM 5000000 UNITS IJ SOLR
5.0000 10*6.[IU] | Freq: Once | INTRAMUSCULAR | Status: AC
  Administered 2010-08-19: 5 10*6.[IU] via INTRAVENOUS
  Filled 2010-08-19: qty 5

## 2010-08-19 MED ORDER — PENICILLIN G POTASSIUM 5000000 UNITS IJ SOLR
2.5000 10*6.[IU] | INTRAVENOUS | Status: DC
  Administered 2010-08-19: 2.5 10*6.[IU] via INTRAVENOUS
  Filled 2010-08-19 (×5): qty 2.5

## 2010-08-19 MED ORDER — OXYCODONE-ACETAMINOPHEN 5-325 MG PO TABS: 2.0000 | ORAL_TABLET | ORAL | Status: DC | PRN

## 2010-08-19 MED ORDER — LACTATED RINGERS IR SOLN
500.0000 mL | Freq: Once | Status: AC
  Administered 2010-08-19: 500 mL

## 2010-08-19 MED ORDER — NIFEDIPINE ER OSMOTIC RELEASE 90 MG PO TB24
90.0000 mg | ORAL_TABLET | Freq: Every day | ORAL | Status: DC
  Administered 2010-08-19 – 2010-08-21 (×3): 90 mg via ORAL
  Filled 2010-08-19 (×3): qty 1

## 2010-08-19 MED ORDER — MEASLES, MUMPS & RUBELLA VAC ~~LOC~~ INJ: 0.5000 mL | INJECTION | Freq: Once | SUBCUTANEOUS | Status: DC

## 2010-08-19 MED ORDER — ZOLPIDEM TARTRATE 5 MG PO TABS: 5.0000 mg | ORAL_TABLET | Freq: Every evening | ORAL | Status: DC | PRN

## 2010-08-19 MED ORDER — OXYTOCIN 20 UNITS IN LACTATED RINGERS INFUSION - SIMPLE
1.0000 m[IU]/min | INTRAVENOUS | Status: DC
  Administered 2010-08-19: 2 m[IU]/min via INTRAVENOUS
  Filled 2010-08-19: qty 1000

## 2010-08-19 MED ORDER — BUPIVACAINE HCL (PF) 0.25 % IJ SOLN
INTRAMUSCULAR | Status: DC | PRN
  Administered 2010-08-19: 5 mL via EPIDURAL

## 2010-08-19 MED ORDER — WITCH HAZEL-GLYCERIN EX PADS
MEDICATED_PAD | CUTANEOUS | Status: DC | PRN
  Administered 2010-08-19: 19:00:00 via TOPICAL

## 2010-08-19 MED ORDER — IBUPROFEN 600 MG PO TABS
600.0000 mg | ORAL_TABLET | Freq: Four times a day (QID) | ORAL | Status: DC
  Administered 2010-08-19 – 2010-08-21 (×6): 600 mg via ORAL
  Filled 2010-08-19 (×7): qty 1

## 2010-08-19 MED ORDER — ONDANSETRON HCL 4 MG/2ML IJ SOLN: 4.0000 mg | Freq: Four times a day (QID) | INTRAMUSCULAR | Status: DC | PRN

## 2010-08-19 MED ORDER — LACTATED RINGERS IV SOLN: INTRAVENOUS | Status: DC

## 2010-08-19 MED ORDER — TERBUTALINE SULFATE 1 MG/ML IJ SOLN: 0.2500 mg | Freq: Once | INTRAMUSCULAR | Status: DC | PRN

## 2010-08-19 MED ORDER — TETANUS-DIPHTH-ACELL PERTUSSIS 5-2.5-18.5 LF-MCG/0.5 IM SUSP
0.5000 mL | Freq: Once | INTRAMUSCULAR | Status: AC
  Administered 2010-08-21: 0.5 mL via INTRAMUSCULAR
  Filled 2010-08-19: qty 0.5

## 2010-08-19 MED ORDER — FLEET ENEMA 7-19 GM/118ML RE ENEM: 1.0000 | ENEMA | RECTAL | Status: DC | PRN

## 2010-08-19 NOTE — Anesthesia Preprocedure Evaluation (Addendum)
Anesthesia Evaluation  Name, MR# and DOB Patient awake  General Assessment Comment  Reviewed: Allergy & Precautions, H&P  and Patient's Chart, lab work & pertinent test results  Airway Mallampati: IV TM Distance: >3 FB Neck ROM: full    Dental No notable dental hx    Pulmonaryneg pulmonary ROS  asthma  clear to auscultation  pulmonary exam normal   Cardiovascular regular Normal   Neuro/PsychNegative Neurological ROS Negative Psych ROS  GI/Hepatic/Renal negative GI ROS, negative Liver ROS, and negative Renal ROS (+)       Endo/Other  Negative Endocrine ROS (+) Diabetes mellitus- Morbid obesity Abdominal   Musculoskeletal  Hematology negative hematology ROS (+)   Peds  Reproductive/Obstetrics (+) Pregnancy   Anesthesia Other Findings             Anesthesia Physical Anesthesia Plan  ASA: III  Anesthesia Plan: Epidural   Post-op Pain Management:    Induction:   Airway Management Planned:   Additional Equipment:   Intra-op Plan:   Post-operative Plan:   Informed Consent: I have reviewed the patients History and Physical, chart, labs and discussed the procedure including the risks, benefits and alternatives for the proposed anesthesia with the patient or authorized representative who has indicated his/her understanding and acceptance.     Plan Discussed with: CRNA and Surgeon  Anesthesia Plan Comments:         Anesthesia Quick Evaluation

## 2010-08-19 NOTE — Progress Notes (Signed)
Savannah Thomas is a 30 y.o. [id number] at [redacted]w[redacted]d by ultrasound admitted for induction of labor due to Hypertension.  Subjective:   Objective: BP 157/94  Pulse 78  Temp(Src) 98.5 F (36.9 C) (Oral)  Resp 20  Ht 5' 7.5" (1.715 m)  Wt 134.718 kg (297 lb)  BMI 45.83 kg/m2      FHT:  FHR: 140's bpm, variability: moderate,  accelerations:  Present,  decelerations:  Absent UC:   none SVE:   Dilation: 3 Effacement (%): 70 Station: -3 Exam by:: l.poore, rn  Labs: Lab Results  Component Value Date   WBC 8.9 08/19/2010   HGB 10.1* 08/19/2010   HCT 31.2* 08/19/2010   MCV 79.0 08/19/2010   PLT 218 08/19/2010    Assessment / Plan: Induction of labor due to gestational hypertension,  progressing well on pitocin  Labor: none Preeclampsia:  no signs or symptoms of toxicity Fetal Wellbeing:  Category I Pain Control:  Labor support without medications I/D:  n/a Anticipated MOD:  NSVD  Alaia Lordi V 08/19/2010, 8:41 AM

## 2010-08-19 NOTE — Plan of Care (Signed)
Problem: Consults Goal: Birthing Suites Patient Information Press F2 to bring up selections list  Outcome: Completed/Met Date Met:  08/19/10  Pt 37-[redacted] weeks EGA and PIH (Pregnancy induced hypertension)

## 2010-08-19 NOTE — Anesthesia Procedure Notes (Addendum)
Epidural Patient location during procedure: OB Start time: 08/19/2010 12:50 PM Reason for block: procedure for pain  Staffing Anesthesiologist: Brayton Caves R Performed by: anesthesiologist   Preanesthetic Checklist Completed: patient identified, site marked, surgical consent, pre-op evaluation, timeout performed, IV checked, risks and benefits discussed and monitors and equipment checked  Epidural Patient position: sitting Prep: site prepped and draped and DuraPrep Patient monitoring: continuous pulse ox and blood pressure Approach: midline Injection technique: LOR air  Needle:  Needle type: Tuohy  Needle gauge: 17 G Needle length: 9 cm Catheter type: closed end flexible Catheter size: 19 Gauge Catheter at skin depth: 13 cm Test dose: negative  Assessment Events: blood not aspirated, injection not painful, no injection resistance, negative IV test and no paresthesia  Additional Notes Patient identified.  Risk benefits discussed including failed block, incomplete pain control, headache, nerve damage, paralysis, blood pressure changes, nausea, vomiting, reactions to medication both toxic or allergic, and postpartum back pain.  Patient expressed understanding and wished to proceed.  All questions were answered.  Sterile technique used throughout procedure and epidural site dressed with sterile barrier dressing. No paresthesia or other complications noted. 5 minutes prior to starting epidural infusion of fentanyl and bupivicaine, the epidural was loaded with 6 ml of 1.5% lidocaine in three separate doses.  The patient did not experience any signs of intravascular injection such as tinnitus or metallic taste in mouth nor signs of intrathecal spread such as rapid motor block. Please see nursing notes for vital signs.

## 2010-08-19 NOTE — Consult Note (Signed)
[facility]  Delivery Note:  Vaginal Birth        08/19/2010  3:52 PM  I was called to Labor and Delivery at request of the patient's obstetrician (Dr. Stefano Gaul).   PRENATAL HX:   PIH  INTRAPARTUM HX:   Induction of labor due to PIH;  Fetal heart rate decelerations.  DELIVERY:   SVD complicated by nuchal cord.  The baby required vigorous stimulation but was apneic and cyanotic during the first 30 seconds.  He was given about 15 seconds of bag/mask ventilations before he began crying (began just before 1 minute age).  He perked up quickly thereafter.  His Apgar scores were 7 and 9.  He was left with the OB team for further care.   _______________________________________________________________________ Electronically Signed By: Angelita Ingles, MD Neonatologist

## 2010-08-19 NOTE — Progress Notes (Signed)
Savannah Thomas is a 30 y.o. [id number] at [redacted]w[redacted]d by ultrasound admitted for induction of labor due to Hypertension.  Subjective:   Objective: BP 149/78  Pulse 66  Temp(Src) 98.5 F (36.9 C) (Oral)  Resp 20  Ht 5' 7.5" (1.715 m)  Wt 134.718 kg (297 lb)  BMI 45.83 kg/m2    IV Labetolol given.  FHT:  FHR: 120's bpm, variability: moderate,  accelerations:  Present,  decelerations:  Present variables UC:   regular, every 4 minutes SVE:   8.5/90/-2  Labs: Lab Results  Component Value Date   WBC 8.9 08/19/2010   HGB 10.1* 08/19/2010   HCT 31.2* 08/19/2010   MCV 79.0 08/19/2010   PLT 218 08/19/2010    Assessment / Plan: Progressing without Pit.  AROM earlier. IUPC placed.  Decels noted. Amnioinfusion begun.  FHT now cat. 1. Anticipated MOD:  NSVD  Savannah Thomas 08/19/2010, 1:18 PM

## 2010-08-20 LAB — CBC
MCV: 79.4 fL (ref 78.0–100.0)
Platelets: 194 10*3/uL (ref 150–400)
RBC: 3.84 MIL/uL — ABNORMAL LOW (ref 3.87–5.11)
RDW: 15.4 % (ref 11.5–15.5)
WBC: 14.1 10*3/uL — ABNORMAL HIGH (ref 4.0–10.5)

## 2010-08-20 MED ORDER — FLUOXETINE HCL 20 MG/5ML PO SOLN: 40.0000 mg | Freq: Every day | ORAL | Status: DC

## 2010-08-20 NOTE — Progress Notes (Addendum)
Post Partum Day 1 Subjective: no complaints, up ad lib, voiding, tolerating PO and No headaches, blurred vision, or RUQ pain.  Objective: Blood pressure 128/85, pulse 99, temperature 98.3 F (36.8 C), temperature source Oral, resp. rate 20, height 5' 7.5" (1.715 m), weight 134.718 kg (297 lb), SpO2 97.00%.  Physical Exam:  General: alert Lochia: appropriate Uterine Fundus: firm Incision: healing well DVT Evaluation: No evidence of DVT seen on physical exam. Negative Homan's sign. No cords or calf tenderness. No significant calf/ankle edema.   Basename 08/20/10 0528 08/19/10 0718  HGB 9.8* 10.1*  HCT 30.5* 31.2*    Assessment/Plan: Plan for discharge tomorrow, Breastfeeding and Contraception Nexpalanon    LOS: 1 day   Betsy Rosello V 08/20/2010, 12:12 PM

## 2010-08-20 NOTE — Progress Notes (Signed)
Mom reports breastfeeding going well, has started supplementing with bottle, Advised to keep baby at breast every 3 hours on whenever she sees feeding ques. Decrease bottles. Discussed supply and demand/Engorgement with supplementing when not medically necessary.  Lactation brochure given to mom. Community resources for breastfeeding mothers discussed. Advised of outpatient services if needed.

## 2010-08-20 NOTE — H&P (Signed)
.  Savannah Thomas is a 30 y.o. female presenting for induction for PIH. @IPILAPH @ OB History    Grav Para Term Preterm Abortions TAB SAB Ect Mult Living   7 3 2 1 3 2 1   3      Past Medical History  Diagnosis Date  . Abnormal Pap smear   . Preterm labor   . Fibroids   . Chlamydia   . Candidiasis   . Asthma   . Migraines   . Gestational diabetes    Past Surgical History  Procedure Date  . Dilation and curettage, diagnostic / therapeutic     TAB  . Dilation and curettage of uterus   . Cesarean section   . No past surgeries    Family History: family history includes Cancer in her maternal grandmother; Heart disease in her cousin and paternal grandfather; and Hypertension in her father, maternal grandfather, maternal grandmother, mother, paternal grandfather, and paternal grandmother. Social History:  reports that she has never smoked. She has never used smokeless tobacco. She reports that she does not drink alcohol or use illicit drugs.  @ROS @  Dilation: 10 Effacement (%): 90 Station: -1 Exam by:: Dr. Stefano Gaul Blood pressure 127/75, pulse 96, temperature 98.4 F (36.9 C), temperature source Oral, resp. rate 20, height 5' 7.5" (1.715 m), weight 134.718 kg (297 lb), SpO2 97.00%. @IPILAEXAM @ @PHYSEXAMBYAGE2 @  Prenatal labs: ABO, Rh: A POS (11/22 1215) Antibody: Negative (12/28 0000) Rubella:   RPR: NON REACTIVE (07/20 0718)  HBsAg: Negative (12/28 0000)  HIV: Non-reactive (12/09 0000)  GBS: Positive (01/18 0000)   Assessment/Plan: Term pregnancy with PIH GBS bactiuria   Savannah Thomas 08/20/2010, 5:24 PM

## 2010-08-21 MED ORDER — OXYCODONE-ACETAMINOPHEN 5-325 MG PO TABS: 1.0000 | ORAL_TABLET | ORAL | Status: AC | PRN

## 2010-08-21 MED ORDER — TETANUS-DIPHTH-ACELL PERTUSSIS 5-2.5-18.5 LF-MCG/0.5 IM SUSP: 0.5000 mL | Freq: Once | INTRAMUSCULAR | Status: DC

## 2010-08-21 MED ORDER — NIFEDIPINE ER OSMOTIC RELEASE 90 MG PO TB24: 90.0000 mg | ORAL_TABLET | Freq: Every day | ORAL | Status: DC

## 2010-08-21 MED ORDER — IBUPROFEN 600 MG PO TABS: 600.0000 mg | ORAL_TABLET | Freq: Four times a day (QID) | ORAL | Status: AC

## 2010-08-21 NOTE — Progress Notes (Signed)
Mom reports no questions or concerns for lactation. Advised of outpatient services available if needed.

## 2010-08-21 NOTE — Anesthesia Postprocedure Evaluation (Signed)
  Anesthesia Post-op Note  Patient: Savannah Thomas  Procedure(s) Performed: * No procedures listed *  Patient's cardiopulmonary status is stable Patient's level of consciousness: sedate but responsive verbally Pain and nausea are all reasonably controlled No anesthetic complications apparent at this time No follow up care necessary at this time

## 2010-08-21 NOTE — Discharge Summary (Signed)
   Obstetric Discharge Summary Reason for Admission: IOL of for [facility] Prenatal Procedures: none Intrapartum Procedures: spontaneous vaginal delivery antibiotics for GBS prophylaxis Postpartum Procedures: none Complications-Operative and Postpartum: none  Temp:  [98.2 F (36.8 C)-98.5 F (36.9 C)] 98.2 F (36.8 C) (07/22 0516) Pulse Rate:  [86-96] 92  (07/22 0516) Resp:  [20] 20  (07/22 0516) BP: (118-127)/(75-80) 118/79 mmHg (07/22 0516) Hemoglobin  Date Value Range Status  08/20/2010 9.8* 12.0-15.0 (g/dL) Final     HCT  Date Value Range Status  08/20/2010 30.5* 36.0-46.0 (%) Final    Hospital Course:  Pt was started on pitocin and progressed well for a SVD, pt was started on procardia for HTN, otherwise followed a routine pp course.  Discharge Diagnoses: Term Pregnancy-delivered and hypertension  Discharge Information: Date: 08/21/2010 Activity: pelvic rest Diet: routine Medications:  Medication List  As of 08/21/2010  9:29 AM   START taking these medications         ibuprofen 600 MG tablet   Commonly known as: ADVIL,MOTRIN   Take 1 tablet (600 mg total) by mouth every 6 (six) hours.      NIFEdipine 90 MG 24 hr tablet   Commonly known as: PROCARDIA XL/ADALAT-CC   Take 1 tablet (90 mg total) by mouth daily.      oxyCODONE-acetaminophen 5-325 MG per tablet   Commonly known as: PERCOCET   Take 1-2 tablets by mouth every 3 (three) hours as needed (moderate - severe pain).      TDaP 5-2.5-18.5 LF-MCG/0.5 injection   Commonly known as: BOOSTRIX   Inject 0.5 mLs into the muscle once.         CONTINUE taking these medications         prenatal vitamin w/FE, FA 27-1 MG Tabs          Where to get your medications    These are the prescriptions that you need to pick up.   You may get these medications from any pharmacy.         ibuprofen 600 MG tablet   NIFEdipine 90 MG 24 hr tablet   TDaP 5-2.5-18.5 LF-MCG/0.5 injection         Information on where to get these  meds is not yet available. Ask your nurse or doctor.         oxyCODONE-acetaminophen 5-325 MG per tablet           Condition: stable Instructions: refer to practice specific booklet Discharge to: home   Newborn Data: Live born  Information for the patient's newborn:  Shauni, Henner [[id number]]  female ; APGAR 7, 9, ; weight 7#11oz  infant staying overnight for increased bilirubin. Infant to have circumcision for discharge.  LILLARD,SHELLEY M 08/21/2010, 9:29 AM

## 2010-08-21 NOTE — Progress Notes (Signed)
  Post Partum Day 2 Subjective: no complaints, up ad lib, voiding, tolerating PO and reports leg/foot swelling is about the same. Still having headaches with no visual changes, or N/V,  BF fair with some assistance from lactation Mood stable, bonding well Infant to stay overnight for high bilirubin   Objective: Blood pressure 118/79, pulse 92, temperature 98.2 F (36.8 C), temperature source Oral, resp. rate 20, height 5' 7.5" (1.715 m), weight 134.718 kg (297 lb), SpO2 97.00%.  Physical Exam:  General: alert and no distress Lungs: CTAB Heart: RRR Lochia: appropriate Uterine Fundus: firm DVT Evaluation: No evidence of DVT seen on physical exam. Negative Homan's sign.   Basename 08/20/10 0528 08/19/10 0718  HGB 9.8* 10.1*  HCT 30.5* 31.2*    Assessment/Plan: Discharge home, Breastfeeding, Circumcision prior to discharge and Contraception mirena IUD Pt is stable Will continue procardia at home and a nurse to come check BP at home in 1 week.    LOS: 2 days   Jove Beyl M 08/21/2010, 9:26 AM

## 2010-08-22 NOTE — Progress Notes (Signed)
UR chart review completed.  

## 2010-10-24 LAB — POCT URINALYSIS DIP (DEVICE)
Glucose, UA: NEGATIVE
Nitrite: NEGATIVE
Operator id: 239701
Protein, ur: 30 — AB
Specific Gravity, Urine: 1.015
Urobilinogen, UA: 2 — ABNORMAL HIGH

## 2010-10-24 LAB — POCT PREGNANCY, URINE: Operator id: 239701

## 2010-10-27 LAB — URINALYSIS, ROUTINE W REFLEX MICROSCOPIC
Glucose, UA: NEGATIVE
Ketones, ur: NEGATIVE
Nitrite: NEGATIVE
Protein, ur: NEGATIVE
Urobilinogen, UA: 0.2

## 2010-10-27 LAB — WET PREP, GENITAL: Yeast Wet Prep HPF POC: NONE SEEN

## 2010-10-31 LAB — URINALYSIS, ROUTINE W REFLEX MICROSCOPIC
Bilirubin Urine: NEGATIVE
Glucose, UA: NEGATIVE
Ketones, ur: NEGATIVE
Nitrite: NEGATIVE
Specific Gravity, Urine: 1.01
pH: 7

## 2010-10-31 LAB — URINE MICROSCOPIC-ADD ON

## 2010-11-01 LAB — URINALYSIS, ROUTINE W REFLEX MICROSCOPIC
Hgb urine dipstick: NEGATIVE
Nitrite: NEGATIVE
Protein, ur: NEGATIVE
Specific Gravity, Urine: <1.005 — ABNORMAL LOW
Urobilinogen, UA: 0.2

## 2010-11-01 LAB — URINE CULTURE

## 2010-11-01 LAB — GLUCOSE, CAPILLARY: Glucose-Capillary: 86

## 2010-11-04 LAB — CBC
HCT: 32.5 % — ABNORMAL LOW (ref 36.0–46.0)
Hemoglobin: 10.3 g/dL — ABNORMAL LOW (ref 12.0–15.0)
Hemoglobin: 9.2 g/dL — ABNORMAL LOW (ref 12.0–15.0)
MCHC: 32.8 g/dL (ref 30.0–36.0)
MCV: 76.7 fL — ABNORMAL LOW (ref 78.0–100.0)
MCV: 77.6 fL — ABNORMAL LOW (ref 78.0–100.0)
RBC: 3.62 MIL/uL — ABNORMAL LOW (ref 3.87–5.11)
RDW: 16.9 % — ABNORMAL HIGH (ref 11.5–15.5)
WBC: 10.4 10*3/uL (ref 4.0–10.5)

## 2010-11-09 LAB — POCT PREGNANCY, URINE
Operator id: 239701
Preg Test, Ur: NEGATIVE

## 2010-11-15 ENCOUNTER — Encounter (HOSPITAL_COMMUNITY): Payer: Self-pay

## 2010-12-19 ENCOUNTER — Emergency Department (HOSPITAL_COMMUNITY)
Admission: EM | Admit: 2010-12-19 | Discharge: 2010-12-19 | Disposition: A | Discharge status: Home / Self Care | Payer: Self-pay | Attending: Emergency Medicine | Admitting: Emergency Medicine

## 2010-12-19 ENCOUNTER — Encounter (HOSPITAL_COMMUNITY): Payer: Self-pay | Admitting: *Deleted

## 2010-12-19 DIAGNOSIS — J45909 Unspecified asthma, uncomplicated: Secondary | ICD-10-CM | POA: Insufficient documentation

## 2010-12-19 DIAGNOSIS — Z79899 Other long term (current) drug therapy: Secondary | ICD-10-CM | POA: Insufficient documentation

## 2010-12-19 DIAGNOSIS — I10 Essential (primary) hypertension: Secondary | ICD-10-CM | POA: Insufficient documentation

## 2010-12-19 DIAGNOSIS — G43909 Migraine, unspecified, not intractable, without status migrainosus: Secondary | ICD-10-CM | POA: Insufficient documentation

## 2010-12-19 HISTORY — DX: Essential (primary) hypertension: I10

## 2010-12-19 MED ORDER — PROMETHAZINE HCL 25 MG/ML IJ SOLN
25.0000 mg | Freq: Once | INTRAMUSCULAR | Status: AC
  Administered 2010-12-19: 25 mg via INTRAMUSCULAR
  Filled 2010-12-19: qty 1

## 2010-12-19 MED ORDER — ISOMETHEPTENE-APAP-DICHLORAL 65-325-100 MG PO CAPS: 1.0000 | ORAL_CAPSULE | Freq: Four times a day (QID) | ORAL | Status: AC | PRN

## 2010-12-19 MED ORDER — KETOROLAC TROMETHAMINE 60 MG/2ML IM SOLN
60.0000 mg | Freq: Once | INTRAMUSCULAR | Status: AC
  Administered 2010-12-19: 60 mg via INTRAMUSCULAR
  Filled 2010-12-19: qty 2

## 2010-12-19 NOTE — ED Notes (Signed)
Pt reports headache better 5/10

## 2010-12-19 NOTE — ED Notes (Signed)
VSS; A&Ox3; no acute signs of distress; ambulated with a steady gait; reported she will follow d/c instructions and take prescriptions as prescribed.

## 2010-12-19 NOTE — ED Notes (Signed)
Patient started experiencing a headache this afternoon and her blood pressure was high so she took her procardia.

## 2010-12-19 NOTE — ED Provider Notes (Signed)
History     CSN: [id number] Arrival date & time: 12/19/2010  1:47 AM   First MD Initiated Contact with Patient 12/19/10 0207      Chief Complaint  Patient presents with  . Headache    (Consider location/radiation/quality/duration/timing/severity/associated sxs/prior treatment) Patient is a 30 y.o. female presenting with headaches. The history is provided by the patient.  Headache  This is a new problem. The current episode started yesterday. The problem occurs constantly. The problem has not changed since onset.The headache is associated with bright light and loud noise. The pain is located in the left unilateral region. The pain is moderate. The pain does not radiate. Pertinent negatives include no fever, no near-syncope, no syncope, no nausea and no vomiting. She has tried acetaminophen for the symptoms. The treatment provided no relief.  Pt states years ago she had migraines. Today she also noted that her blood pressure was elevated so she took one of her old blood pressure medications that she took when she was pregnant.  Past Medical History  Diagnosis Date  . Abnormal Pap smear   . Preterm labor   . Fibroids   . Chlamydia   . Candidiasis   . Asthma   . Migraines   . Hypertension     Past Surgical History  Procedure Date  . Dilation and curettage, diagnostic / therapeutic     TAB  . Dilation and curettage of uterus   . Cesarean section   . No past surgeries     Family History  Problem Relation Age of Onset  . Hypertension Mother   . Hypertension Father   . Cancer Maternal Grandmother   . Hypertension Maternal Grandmother   . Hypertension Maternal Grandfather   . Hypertension Paternal Grandmother   . Heart disease Paternal Grandfather   . Hypertension Paternal Grandfather   . Heart disease Cousin     History  Substance Use Topics  . Smoking status: Never Smoker   . Smokeless tobacco: Never Used  . Alcohol Use: No    OB History    Grav Para Term  Preterm Abortions TAB SAB Ect Mult Living   7 4 3 1 3 2 1   4       Review of Systems  Constitutional: Negative for fever.  Cardiovascular: Negative for syncope and near-syncope.  Gastrointestinal: Negative for nausea and vomiting.  Neurological: Positive for headaches.    Allergies  Review of patient's allergies indicates no known allergies.  Home Medications   Current Outpatient Rx  Name Route Sig Dispense Refill  . NIFEDIPINE ER OSMOTIC 90 MG PO TB24 Oral Take 1 tablet (90 mg total) by mouth daily. 30 tablet 1  . PRENATAL PLUS 27-1 MG PO TABS Oral Take 1 tablet by mouth daily.      Alice Reichert PERTUSSIS 5-2.5-18.5 LF-MCG/0.5 IM SUSP Intramuscular Inject 0.5 mLs into the muscle once. 0.5 mL 0    BP 129/77  Pulse 107  Temp 98.5 F (36.9 C)  SpO2 99%  Breastfeeding? Unknown  Physical Exam  Nursing note and vitals reviewed. Constitutional: She appears well-developed and well-nourished. No distress.  HENT:  Head: Normocephalic and atraumatic.  Right Ear: External ear normal.  Left Ear: External ear normal.  Eyes: Conjunctivae are normal. Right eye exhibits no discharge. Left eye exhibits no discharge. No scleral icterus.  Neck: Neck supple. No tracheal deviation present.       No meningeal signs  Cardiovascular: Normal rate, regular rhythm and intact distal pulses.  Pulmonary/Chest: Effort normal and breath sounds normal. No stridor. No respiratory distress. She has no wheezes. She has no rales.  Abdominal: Soft. Bowel sounds are normal. She exhibits no distension. There is no tenderness. There is no rebound and no guarding.  Musculoskeletal: She exhibits no edema and no tenderness.  Neurological: She is alert. She has normal strength. No sensory deficit. Cranial nerve deficit:  no gross defecits noted. She exhibits normal muscle tone. She displays no seizure activity. Coordination normal.  Skin: Skin is warm and dry. No rash noted.  Psychiatric: She has a  normal mood and affect.    ED Course  Procedures (including critical care time)  Medications  ketorolac (TORADOL) injection 60 mg (not administered)  promethazine (PHENERGAN) injection 25 mg (not administered)   patient states she did not want an IV. 3:35 AM patient states she's feeling much better. She is ready for discharge Labs Reviewed - No data to display No results found.   1. Migraine headache       MDM  Patient has remote history of migraines. I suspect this is the same. She has no fevers or neck stiffness to suggest meningitis. Patient does not have any history of sudden thunderclap onset to suggest a spontaneous hemorrhage        Celene Kras, MD 12/19/10 [phone]

## 2011-10-12 ENCOUNTER — Emergency Department (INDEPENDENT_AMBULATORY_CARE_PROVIDER_SITE_OTHER): Payer: Self-pay

## 2011-10-12 ENCOUNTER — Emergency Department (INDEPENDENT_AMBULATORY_CARE_PROVIDER_SITE_OTHER)
Admission: EM | Admit: 2011-10-12 | Discharge: 2011-10-12 | Disposition: A | Discharge status: Home / Self Care | Payer: Self-pay | Source: Home / Self Care | Attending: Emergency Medicine | Admitting: Emergency Medicine

## 2011-10-12 ENCOUNTER — Encounter (HOSPITAL_COMMUNITY): Payer: Self-pay | Admitting: Emergency Medicine

## 2011-10-12 DIAGNOSIS — R079 Chest pain, unspecified: Secondary | ICD-10-CM

## 2011-10-12 LAB — D-DIMER, QUANTITATIVE: D-Dimer, Quant: <0.27 ug/mL-FEU (ref 0.00–0.48)

## 2011-10-12 LAB — CBC WITH DIFFERENTIAL/PLATELET
Eosinophils Absolute: 0.1 10*3/uL (ref 0.0–0.7)
Hemoglobin: 13.1 g/dL (ref 12.0–15.0)
Lymphocytes Relative: 40 % (ref 12–46)
Lymphs Abs: 3.7 10*3/uL (ref 0.7–4.0)
MCH: 27.2 pg (ref 26.0–34.0)
Monocytes Relative: 4 % (ref 3–12)
Neutro Abs: 5.2 10*3/uL (ref 1.7–7.7)
Neutrophils Relative %: 55 % (ref 43–77)
Platelets: 313 10*3/uL (ref 150–400)
RBC: 4.81 MIL/uL (ref 3.87–5.11)
WBC: 9.4 10*3/uL (ref 4.0–10.5)

## 2011-10-12 MED ORDER — DICLOFENAC SODIUM 75 MG PO TBEC: 75.0000 mg | DELAYED_RELEASE_TABLET | Freq: Two times a day (BID) | ORAL | Status: DC

## 2011-10-12 NOTE — ED Provider Notes (Signed)
Chief Complaint  Patient presents with  . Flank Pain    History of Present Illness:   The patient is a 31 year old female who has had a six-day history of pain in the left, lower, lateral rib cage area radiating towards the left flank. The pain is worse if she takes a deep or shallow breath. She denies any pain with movement. She denies any pain with coughing, sneezing, or laughing. She denies any trauma to the area, swelling, or rash. It's not tender to palpation. She's had no fever, chills, or sweats. She denies any anterior chest pain, coughing, wheezing, or shortness of breath. She's had no palpitations, dizziness, syncope, or ankle edema. She denies any abdominal pain, nausea, or vomiting.  Review of Systems:  Other than noted above, the patient denies any of the following symptoms. Systemic:  No fever, chills, sweats, or fatigue. ENT:  No nasal congestion, rhinorrhea, or sore throat. Pulmonary:  No cough, wheezing, shortness of breath, sputum production, hemoptysis. Cardiac:  No palpitations, rapid heartbeat, dizziness, presyncope or syncope. GI:  No abdominal pain, heartburn, nausea, or vomiting. Ext:  No leg pain or swelling.  PMFSH:  Past medical history, family history, social history, meds, and allergies were reviewed and updated as needed.   Physical Exam:   Vital signs:  BP 119/76  Pulse 90  Temp 98.2 F (36.8 C) (Oral)  Resp 16  SpO2 99%  LMP 08/28/2011  Breastfeeding? No Gen:  Alert, oriented, in no distress, skin warm and dry. Eye:  PERRL, lids and conjunctivas normal.  Sclera non-icteric. ENT:  Mucous membranes moist, pharynx clear. Neck:  Supple, no adenopathy or tenderness.  No JVD. Lungs:  Clear to auscultation, no wheezes, rales or rhonchi.  No respiratory distress. Heart:  Regular rhythm.  No gallops, murmers, clicks or rubs. Chest:  No chest wall tenderness. Abdomen:  Soft, nontender, no organomegaly or mass.  Bowel sounds normal.  No pulsatile abdominal mass  or bruit. Ext:  No edema.  No calf tenderness and Homann's sign negative.  Pulses full and equal. Skin:  Warm and dry.  No rash.  Labs:   Results for orders placed during the hospital encounter of 10/12/11  D-DIMER, QUANTITATIVE      Component Value Range   D-Dimer, Quant <0.27  0.00 - 0.48 ug/mL-FEU  CBC WITH DIFFERENTIAL      Component Value Range   WBC 9.4  4.0 - 10.5 K/uL   RBC 4.81  3.87 - 5.11 MIL/uL   Hemoglobin 13.1  12.0 - 15.0 g/dL   HCT 16.1  09.6 - 04.5 %   MCV 80.7  78.0 - 100.0 fL   MCH 27.2  26.0 - 34.0 pg   MCHC 33.8  30.0 - 36.0 g/dL   RDW 40.9  81.1 - 91.4 %   Platelets 313  150 - 400 K/uL   Neutrophils Relative 55  43 - 77 %   Neutro Abs 5.2  1.7 - 7.7 K/uL   Lymphocytes Relative 40  12 - 46 %   Lymphs Abs 3.7  0.7 - 4.0 K/uL   Monocytes Relative 4  3 - 12 %   Monocytes Absolute 0.4  0.1 - 1.0 K/uL   Eosinophils Relative 1  0 - 5 %   Eosinophils Absolute 0.1  0.0 - 0.7 K/uL   Basophils Relative 0  0 - 1 %   Basophils Absolute 0.0  0.0 - 0.1 K/uL     Radiology:  Dg Chest 2 View  10/12/2011  *RADIOLOGY REPORT*  Clinical Data: Left pleuritic chest pain  CHEST - 2 VIEW  Comparison: 05/27/2009  Findings: Normal heart size and vascularity.  Negative for pneumonia, collapse, consolidation, edema, effusion or pneumothorax.  Trachea midline.  IMPRESSION: No acute chest disease   Original Report Authenticated By: Judie Petit. Ruel Favors, M.D.    Assessment:  The encounter diagnosis was Chest pain.  She has pleuritic, left lower chest pain. There is no evidence for pulmonary embolus, infection, or lung disease. This could be viral pleurisy or musculoskeletal, possibly originating from costochondritis. For right now I would like to treat with anti-inflammatory and have her return again if this fails to improve within 48 hours  Plan:   1.  The following meds were prescribed:   New Prescriptions   DICLOFENAC (VOLTAREN) 75 MG EC TABLET    Take 1 tablet (75 mg total) by mouth 2  (two) times daily.   2.  The patient was instructed in symptomatic care and handouts were given. 3.  The patient was told to return if becoming worse in any way, if no better in 3 or 4 days, and given some red flag symptoms that would indicate earlier return.    Reuben Likes, MD 10/12/11 [phone]

## 2011-10-12 NOTE — ED Notes (Signed)
C/o of pain to left side/flank area since Sat. Denies urinary symp States pain increases with breathing. Denies injury

## 2012-02-22 ENCOUNTER — Other Ambulatory Visit (HOSPITAL_COMMUNITY)
Admission: RE | Admit: 2012-02-22 | Discharge: 2012-02-22 | Disposition: A | Discharge status: Home / Self Care | Payer: Self-pay | Source: Ambulatory Visit | Attending: Family Medicine | Admitting: Family Medicine

## 2012-02-22 ENCOUNTER — Emergency Department (INDEPENDENT_AMBULATORY_CARE_PROVIDER_SITE_OTHER)
Admission: EM | Admit: 2012-02-22 | Discharge: 2012-02-22 | Disposition: A | Discharge status: Home / Self Care | Payer: Self-pay | Source: Home / Self Care | Attending: Family Medicine | Admitting: Family Medicine

## 2012-02-22 ENCOUNTER — Encounter (HOSPITAL_COMMUNITY): Payer: Self-pay | Admitting: Emergency Medicine

## 2012-02-22 DIAGNOSIS — N760 Acute vaginitis: Secondary | ICD-10-CM

## 2012-02-22 DIAGNOSIS — Z113 Encounter for screening for infections with a predominantly sexual mode of transmission: Secondary | ICD-10-CM | POA: Insufficient documentation

## 2012-02-22 DIAGNOSIS — IMO0002 Reserved for concepts with insufficient information to code with codable children: Secondary | ICD-10-CM

## 2012-02-22 LAB — POCT URINALYSIS DIP (DEVICE)
Bilirubin Urine: NEGATIVE
Glucose, UA: NEGATIVE mg/dL
Hgb urine dipstick: NEGATIVE
Ketones, ur: NEGATIVE mg/dL
Leukocytes, UA: NEGATIVE
Nitrite: NEGATIVE
pH: 5.5 (ref 5.0–8.0)

## 2012-02-22 LAB — GLUCOSE, CAPILLARY: Glucose-Capillary: 94 mg/dL (ref 70–99)

## 2012-02-22 MED ORDER — FLUCONAZOLE 150 MG PO TABS: ORAL_TABLET | ORAL | Status: DC

## 2012-02-22 MED ORDER — CEPHALEXIN 500 MG PO CAPS: 500.0000 mg | ORAL_CAPSULE | Freq: Two times a day (BID) | ORAL | Status: DC

## 2012-02-22 MED ORDER — METRONIDAZOLE 500 MG PO TABS: 500.0000 mg | ORAL_TABLET | Freq: Two times a day (BID) | ORAL | Status: DC

## 2012-02-22 MED ORDER — TRAMADOL HCL 50 MG PO TABS: 50.0000 mg | ORAL_TABLET | Freq: Four times a day (QID) | ORAL | Status: DC | PRN

## 2012-02-22 NOTE — ED Notes (Signed)
Pt c/o ring finger of right hand at the distal end of finger is red and swollen, with heat to touch x 2 days. otc medication for pain with mild relief.  Pt also c/o a milky white discharge with foul smelling urine. Some mild right sided pelvic pain x 1wk  Pt denies nausea/vomiting and any concerns for std's

## 2012-02-22 NOTE — ED Notes (Signed)
Waiting discharge papers 

## 2012-02-22 NOTE — ED Provider Notes (Signed)
History     CSN: [id number]  Arrival date & time 02/22/12  4098   First MD Initiated Contact with Patient 02/22/12 1900      Chief Complaint  Patient presents with  . Hand Pain    right hand ring finger. red swollen and hot to touch x 2 days gradually getting worse.  Marland Kitchen Cystitis    dark foul smelling urine. milky white discharge.     (Consider location/radiation/quality/duration/timing/severity/associated sxs/prior treatment) HPI Comments: 32 year old morbidly obese female here with the following complaints: #1) urinary frequency, concentrated urine for one week. Denies dysuria or hematuria. Symptoms associated with being vaginal discharge with bad smell and intermittent vaginal itchiness. Denies pelvic pain. No fever or chills. States she had steady sexual partner for the last 2 years. Patient has a migraine IUD. She's not breast-feeding. #2) right hand fourth finger with tenderness and swelling at the tip for 2 days. No spontaneous drainage. No known injury.   Past Medical History  Diagnosis Date  . Abnormal Pap smear   . Preterm labor   . Fibroids   . Chlamydia   . Candidiasis   . Asthma   . Migraines   . Hypertension     Past Surgical History  Procedure Date  . Dilation and curettage, diagnostic / therapeutic     TAB  . Dilation and curettage of uterus   . Cesarean section   . No past surgeries     Family History  Problem Relation Age of Onset  . Hypertension Mother   . Hypertension Father   . Cancer Maternal Grandmother   . Hypertension Maternal Grandmother   . Hypertension Maternal Grandfather   . Hypertension Paternal Grandmother   . Heart disease Paternal Grandfather   . Hypertension Paternal Grandfather   . Heart disease Cousin     History  Substance Use Topics  . Smoking status: Never Smoker   . Smokeless tobacco: Never Used  . Alcohol Use: No    OB History    Grav Para Term Preterm Abortions TAB SAB Ect Mult Living   7 4 3 1 3 2 1   4        Review of Systems  Constitutional: Negative for fever, chills and appetite change.  Gastrointestinal: Negative for nausea, vomiting, abdominal pain and diarrhea.  Genitourinary: Positive for frequency and vaginal discharge. Negative for dysuria, urgency, hematuria, flank pain, vaginal bleeding and pelvic pain.  Skin:       Right 4th fingertip swelling and tenderness as per HPI  Neurological: Negative for dizziness and headaches.    Allergies  Review of patient's allergies indicates no known allergies.  Home Medications   Current Outpatient Rx  Name  Route  Sig  Dispense  Refill  . CEPHALEXIN 500 MG PO CAPS   Oral   Take 1 capsule (500 mg total) by mouth 2 (two) times daily.   7 capsule   0   . DICLOFENAC SODIUM 75 MG PO TBEC   Oral   Take 1 tablet (75 mg total) by mouth 2 (two) times daily.   20 tablet   0   . FLUCONAZOLE 150 MG PO TABS      1 tab by mouth Q. 72 hours x2   2 tablet   0   . METRONIDAZOLE 500 MG PO TABS   Oral   Take 1 tablet (500 mg total) by mouth 2 (two) times daily.   14 tablet   0   . NIFEDIPINE ER OSMOTIC  90 MG PO TB24   Oral   Take 1 tablet (90 mg total) by mouth daily.   30 tablet   1   . PRENATAL PLUS 27-1 MG PO TABS   Oral   Take 1 tablet by mouth daily.           Alice Reichert PERTUSSIS 5-2.5-18.5 LF-MCG/0.5 IM SUSP   Intramuscular   Inject 0.5 mLs into the muscle once.   0.5 mL   0   . TRAMADOL HCL 50 MG PO TABS   Oral   Take 1 tablet (50 mg total) by mouth every 6 (six) hours as needed for pain.   15 tablet   0     There were no vitals taken for this visit.  Physical Exam  Nursing note and vitals reviewed. Constitutional: She is oriented to person, place, and time. She appears well-developed and well-nourished. No distress.       Morbidly obese  HENT:  Head: Normocephalic and atraumatic.  Eyes: Conjunctivae normal are normal. No scleral icterus.  Neck: Neck supple. No JVD present. No thyromegaly  present.  Cardiovascular: Normal heart sounds.   Pulmonary/Chest: Breath sounds normal.  Abdominal: Soft. There is no tenderness. Hernia confirmed negative in the right inguinal area and confirmed negative in the left inguinal area.  Genitourinary: Uterus normal. There is no rash or lesion on the right labia. There is no rash or lesion on the left labia. Cervix exhibits discharge. Cervix exhibits no motion tenderness and no friability. Right adnexum displays no mass, no tenderness and no fullness. Left adnexum displays no mass, no tenderness and no fullness. There is erythema around the vagina. No bleeding around the vagina. Vaginal discharge found.    Lymphadenopathy:    She has no cervical adenopathy.       Right: No inguinal adenopathy present.       Left: No inguinal adenopathy present.  Neurological: She is alert and oriented to person, place, and time.  Skin: She is not diaphoretic.       Right fourth finger: erythema fluctuation and tenderness around nail, consistent with medial side paronychia.     ED Course  INCISION AND DRAINAGE Performed by: Sharin Grave Authorized by: Sharin Grave Consent: Verbal consent obtained. Risks and benefits: risks, benefits and alternatives were discussed Consent given by: patient Patient understanding: patient states understanding of the procedure being performed Patient consent: the patient's understanding of the procedure matches consent given Type: abscess Body area: upper extremity Location details: right ring finger Anesthesia: local infiltration Local anesthetic: lidocaine 1% without epinephrine Scalpel size: 11 Incision type: single straight Complexity: simple Drainage: purulent Drainage amount: moderate Packing material: Vaseline gauze Patient tolerance: Patient tolerated the procedure well with no immediate complications.   (including critical care time)   Labs Reviewed  POCT URINALYSIS DIP (DEVICE)  POCT  PREGNANCY, URINE  GLUCOSE, CAPILLARY  CULTURE, ROUTINE-ABSCESS  CERVICOVAGINAL ANCILLARY ONLY   No results found.   1. Vaginitis   2. Paronychia       MDM  #1) vaginitis treated empirically with Flagyl and Diflucan. As patient presented also with urinary frequency, CBG was checked and was normal. Wet prep, GC and Chlamydia pending at the time of discharge. #2) left fourth finger medial paronychia: Status post incision and drainage today. Prescribed Keflex and wound care instructions were discussed with patient and provided in writing. Supportive care and red flags that should prompt her return to medical attention discussed with patient and provided in writing.  Sharin Grave, MD 02/23/12 1122

## 2012-02-23 NOTE — ED Notes (Signed)
GC/Chlamydia neg., Affirm: Candida and Trich neg.,  Gardnernella pos.  Abscess culture pending.  Pt. adequately treated with Flagyl.Vassie Moselle 02/23/2012

## 2012-02-24 LAB — CULTURE, ROUTINE-ABSCESS

## 2012-03-11 NOTE — ED Notes (Signed)
Abscess culture: Multiple organisms present, none predominant. No further action needed. Savannah Thomas 03/11/2012

## 2012-03-19 ENCOUNTER — Emergency Department (HOSPITAL_COMMUNITY)
Admission: EM | Admit: 2012-03-19 | Discharge: 2012-03-19 | Disposition: A | Discharge status: Home / Self Care | Payer: Self-pay | Attending: Emergency Medicine | Admitting: Emergency Medicine

## 2012-03-19 ENCOUNTER — Encounter (HOSPITAL_COMMUNITY): Payer: Self-pay | Admitting: Emergency Medicine

## 2012-03-19 DIAGNOSIS — Z8742 Personal history of other diseases of the female genital tract: Secondary | ICD-10-CM | POA: Insufficient documentation

## 2012-03-19 DIAGNOSIS — Z8751 Personal history of pre-term labor: Secondary | ICD-10-CM | POA: Insufficient documentation

## 2012-03-19 DIAGNOSIS — I10 Essential (primary) hypertension: Secondary | ICD-10-CM | POA: Insufficient documentation

## 2012-03-19 DIAGNOSIS — H6690 Otitis media, unspecified, unspecified ear: Secondary | ICD-10-CM | POA: Insufficient documentation

## 2012-03-19 DIAGNOSIS — Z975 Presence of (intrauterine) contraceptive device: Secondary | ICD-10-CM | POA: Insufficient documentation

## 2012-03-19 DIAGNOSIS — Z8619 Personal history of other infectious and parasitic diseases: Secondary | ICD-10-CM | POA: Insufficient documentation

## 2012-03-19 DIAGNOSIS — Z8679 Personal history of other diseases of the circulatory system: Secondary | ICD-10-CM | POA: Insufficient documentation

## 2012-03-19 DIAGNOSIS — J45909 Unspecified asthma, uncomplicated: Secondary | ICD-10-CM | POA: Insufficient documentation

## 2012-03-19 DIAGNOSIS — J029 Acute pharyngitis, unspecified: Secondary | ICD-10-CM | POA: Insufficient documentation

## 2012-03-19 MED ORDER — HYDROCODONE-ACETAMINOPHEN 5-325 MG PO TABS
1.0000 | ORAL_TABLET | Freq: Once | ORAL | Status: AC
  Administered 2012-03-19: 1 via ORAL
  Filled 2012-03-19: qty 1

## 2012-03-19 MED ORDER — TRAMADOL HCL 50 MG PO TABS: 50.0000 mg | ORAL_TABLET | Freq: Four times a day (QID) | ORAL | Status: DC | PRN

## 2012-03-19 MED ORDER — AMOXICILLIN-POT CLAVULANATE 875-125 MG PO TABS: 1.0000 | ORAL_TABLET | Freq: Two times a day (BID) | ORAL | Status: DC

## 2012-03-19 NOTE — ED Provider Notes (Signed)
History     CSN: [id number]  Arrival date & time 03/19/12  1141   First MD Initiated Contact with Patient 03/19/12 1238      Chief Complaint  Patient presents with  . Otalgia  . Sore Throat    (Consider location/radiation/quality/duration/timing/severity/associated sxs/prior treatment) HPI Comments: Pt presents today for increasing left ear pain over the past week.  Pt states her ear is now burning and she has a painful sensation when she swallows but that her throat is not "sore".  No fluid has been draining from the ear.  Has been taking ibuprofen without significant relief.  Denies any SOB, nausea, vomiting, sinus congestion, or cough.  No recent ear infections.  Patient is a 32 y.o. female presenting with ear pain and pharyngitis. The history is provided by the patient.  Otalgia Sore Throat    Past Medical History  Diagnosis Date  . Abnormal Pap smear   . Preterm labor   . Fibroids   . Chlamydia   . Candidiasis   . Asthma   . Migraines   . Hypertension     Past Surgical History  Procedure Laterality Date  . Dilation and curettage, diagnostic / therapeutic      TAB  . Dilation and curettage of uterus    . Cesarean section    . No past surgeries      Family History  Problem Relation Age of Onset  . Hypertension Mother   . Hypertension Father   . Cancer Maternal Grandmother   . Hypertension Maternal Grandmother   . Hypertension Maternal Grandfather   . Hypertension Paternal Grandmother   . Heart disease Paternal Grandfather   . Hypertension Paternal Grandfather   . Heart disease Cousin     History  Substance Use Topics  . Smoking status: Never Smoker   . Smokeless tobacco: Never Used  . Alcohol Use: No    OB History   Grav Para Term Preterm Abortions TAB SAB Ect Mult Living   7 4 3 1 3 2 1   4       Review of Systems  HENT: Positive for ear pain.   All other systems reviewed and are negative.    Allergies  Review of patient's allergies  indicates no known allergies.  Home Medications   Current Outpatient Rx  Name  Route  Sig  Dispense  Refill  . ibuprofen (ADVIL,MOTRIN) 200 MG tablet   Oral   Take 600 mg by mouth 3 (three) times daily as needed for pain.         . Levonorgestrel (MIRENA IU)   Intrauterine   by Intrauterine route.         . naproxen sodium (ALEVE) 220 MG tablet   Oral   Take 440 mg by mouth daily as needed (pain).         Marland Kitchen amoxicillin-clavulanate (AUGMENTIN) 875-125 MG per tablet   Oral   Take 1 tablet by mouth every 12 (twelve) hours.   20 tablet   0   . traMADol (ULTRAM) 50 MG tablet   Oral   Take 1 tablet (50 mg total) by mouth every 6 (six) hours as needed for pain.   15 tablet   0   . traMADol (ULTRAM) 50 MG tablet   Oral   Take 1 tablet (50 mg total) by mouth every 6 (six) hours as needed for pain.   12 tablet   0     BP 156/120  Pulse 99  Temp(Src) 99 F (37.2 C) (Oral)  Resp 24  SpO2 100%  Physical Exam  Nursing note and vitals reviewed. Constitutional: She is oriented to person, place, and time. She appears well-developed and well-nourished.  HENT:  Head: Normocephalic and atraumatic.  Right Ear: Hearing normal. No tenderness. Tympanic membrane is not erythematous and not bulging. A middle ear effusion is present.  Left Ear: Hearing normal. There is tenderness. Tympanic membrane is erythematous. Tympanic membrane is not bulging. A middle ear effusion is present.  Nose: Nose normal.  Mouth/Throat: Uvula is midline and oropharynx is clear and moist. No oropharyngeal exudate or tonsillar abscesses.  Left EAC erythematous  Eyes: Conjunctivae and EOM are normal.  Neck: Normal range of motion. Neck supple.  Cardiovascular: Normal rate, regular rhythm and normal heart sounds.   Pulmonary/Chest: Effort normal and breath sounds normal.  Neurological: She is alert and oriented to person, place, and time.  Skin: Skin is warm and dry.  Psychiatric: She has a normal  mood and affect.    ED Course  Procedures (including critical care time)  Labs Reviewed  RAPID STREP SCREEN   No results found.   1. Otitis media       MDM  Pt presents with increasing left ear pain over the past week.  Has also noticed some pain in her throat when she swallows.  PE revealed left OM with a slight middle ear effusion on the right.  Left canal and TM erythematous but non-bulging.  Oropharynx normal.  Will tx with augmentin due to severity and given tramadol for pain until inflammation calms down.  Instructed she may apply a warm compress to the ear for added relief. Return to the ED for new or worsening symptoms.        Garlon Hatchet, PA-C 03/19/12 1420

## 2012-03-19 NOTE — ED Provider Notes (Signed)
Medical screening examination/treatment/procedure(s) were performed by non-physician practitioner and as supervising physician I was immediately available for consultation/collaboration.   Aleicia Kenagy III, MD 03/19/12 1850 

## 2012-03-19 NOTE — ED Notes (Signed)
Pt c/o left ear pain and sore throat x 1 week worse today

## 2012-03-27 ENCOUNTER — Encounter (HOSPITAL_COMMUNITY): Payer: Self-pay | Admitting: *Deleted

## 2012-03-27 DIAGNOSIS — Z79899 Other long term (current) drug therapy: Secondary | ICD-10-CM | POA: Insufficient documentation

## 2012-03-27 DIAGNOSIS — H6690 Otitis media, unspecified, unspecified ear: Secondary | ICD-10-CM | POA: Insufficient documentation

## 2012-03-27 DIAGNOSIS — R51 Headache: Secondary | ICD-10-CM | POA: Insufficient documentation

## 2012-03-27 DIAGNOSIS — M26629 Arthralgia of temporomandibular joint, unspecified side: Secondary | ICD-10-CM | POA: Insufficient documentation

## 2012-03-27 DIAGNOSIS — Z792 Long term (current) use of antibiotics: Secondary | ICD-10-CM | POA: Insufficient documentation

## 2012-03-27 DIAGNOSIS — J45909 Unspecified asthma, uncomplicated: Secondary | ICD-10-CM | POA: Insufficient documentation

## 2012-03-27 DIAGNOSIS — Z8751 Personal history of pre-term labor: Secondary | ICD-10-CM | POA: Insufficient documentation

## 2012-03-27 DIAGNOSIS — Z8619 Personal history of other infectious and parasitic diseases: Secondary | ICD-10-CM | POA: Insufficient documentation

## 2012-03-27 DIAGNOSIS — I10 Essential (primary) hypertension: Secondary | ICD-10-CM | POA: Insufficient documentation

## 2012-03-27 NOTE — ED Notes (Signed)
Pt states the side of her face and throat hurts. Pt was here last week for same symptoms, and states the medication she was prescribed is not helping her ear infection

## 2012-03-28 ENCOUNTER — Emergency Department (HOSPITAL_COMMUNITY)
Admission: EM | Admit: 2012-03-28 | Discharge: 2012-03-28 | Disposition: A | Discharge status: Home / Self Care | Payer: Self-pay | Attending: Emergency Medicine | Admitting: Emergency Medicine

## 2012-03-28 DIAGNOSIS — S0300XA Dislocation of jaw, unspecified side, initial encounter: Secondary | ICD-10-CM

## 2012-03-28 DIAGNOSIS — R519 Headache, unspecified: Secondary | ICD-10-CM

## 2012-03-28 MED ORDER — OXYCODONE-ACETAMINOPHEN 5-325 MG PO TABS
1.0000 | ORAL_TABLET | Freq: Once | ORAL | Status: AC
  Administered 2012-03-28: 1 via ORAL
  Filled 2012-03-28: qty 1

## 2012-03-28 MED ORDER — HYDROCODONE-ACETAMINOPHEN 5-325 MG PO TABS: 1.0000 | ORAL_TABLET | Freq: Four times a day (QID) | ORAL | Status: DC | PRN

## 2012-03-28 NOTE — ED Provider Notes (Signed)
Medical screening examination/treatment/procedure(s) were performed by non-physician practitioner and as supervising physician I was immediately available for consultation/collaboration.  Ryu Cerreta, MD 03/28/12 2305 

## 2012-03-28 NOTE — ED Provider Notes (Signed)
History     CSN: [id number]  Arrival date & time 03/27/12  2257   First MD Initiated Contact with Patient 03/28/12 0033      Chief Complaint  Patient presents with  . Facial Pain    (Consider location/radiation/quality/duration/timing/severity/associated sxs/prior treatment) HPI Comments: Savannah Thomas is a 32 y.o. female with a  past medical history consistent with migraines presents emergency department with chief complaint of left facial pain.  Patient states that she was treated in the emergency department for left-year-old college diagnosed with otitis media and placed on Augmentin.  Patient is currently still taking antibiotics however there is no relief from her symptoms.  She also reports onset of left-sided headache that is typical with her migraines.  Symptoms are unrelieved by Ultram.  Patient in addition reports increased stress.  She denies fevers, night sweats, chills, facial trauma, change in vision, weakness.  No other complaints at this time.  The history is provided by the patient.    Past Medical History  Diagnosis Date  . Abnormal Pap smear   . Preterm labor   . Fibroids   . Chlamydia   . Candidiasis   . Asthma   . Migraines   . Hypertension     Past Surgical History  Procedure Laterality Date  . Dilation and curettage, diagnostic / therapeutic      TAB  . Dilation and curettage of uterus    . Cesarean section    . No past surgeries      Family History  Problem Relation Age of Onset  . Hypertension Mother   . Hypertension Father   . Cancer Maternal Grandmother   . Hypertension Maternal Grandmother   . Hypertension Maternal Grandfather   . Hypertension Paternal Grandmother   . Heart disease Paternal Grandfather   . Hypertension Paternal Grandfather   . Heart disease Cousin     History  Substance Use Topics  . Smoking status: Never Smoker   . Smokeless tobacco: Never Used  . Alcohol Use: No    OB History   Grav Para Term Preterm  Abortions TAB SAB Ect Mult Living   7 4 3 1 3 2 1   4       Review of Systems  All other systems reviewed and are negative.    Allergies  Review of patient's allergies indicates no known allergies.  Home Medications   Current Outpatient Rx  Name  Route  Sig  Dispense  Refill  . amoxicillin-clavulanate (AUGMENTIN) 875-125 MG per tablet   Oral   Take 1 tablet by mouth every 12 (twelve) hours.   20 tablet   0   . ibuprofen (ADVIL,MOTRIN) 200 MG tablet   Oral   Take 600 mg by mouth 3 (three) times daily as needed for pain.         . Levonorgestrel (MIRENA IU)   Intrauterine   by Intrauterine route.         . naproxen sodium (ALEVE) 220 MG tablet   Oral   Take 440 mg by mouth daily as needed (pain).         . traMADol (ULTRAM) 50 MG tablet   Oral   Take 1 tablet (50 mg total) by mouth every 6 (six) hours as needed for pain.   15 tablet   0   . traMADol (ULTRAM) 50 MG tablet   Oral   Take 1 tablet (50 mg total) by mouth every 6 (six) hours as needed for pain.  12 tablet   0     BP 136/86  Pulse 69  Temp(Src) 98.5 F (36.9 C) (Oral)  Resp 18  SpO2 100%  Physical Exam  Nursing note and vitals reviewed. Constitutional: She is oriented to person, place, and time. She appears well-developed and well-nourished. No distress.  HENT:  Head: Normocephalic and atraumatic.  Left and right tympanic membranes and external ear canals normal no mastoid tenderness or erythema.  Tenderness to palpation over left TMJ and pain with opening and closing of mouth.  Oropharynx clear and moist without exudate.  Eyes: Conjunctivae and EOM are normal.  Neck: Normal range of motion.  Pulmonary/Chest: Effort normal.  Musculoskeletal: Normal range of motion.  Neurological: She is alert and oriented to person, place, and time.  Cranial nerves II through XII intact, good coordination.  Skin: Skin is warm and dry. No rash noted. She is not diaphoretic.  Psychiatric: She has a  normal mood and affect. Her behavior is normal.    ED Course  Procedures (including critical care time)  Labs Reviewed - No data to display No results found.   No diagnosis found.    MDM  Headaches, TMJ  Patient with a history of headaches presents emergency department complaining of headache and left-sided facial pain.  On physical exam tenderness to TMJ with opening and closing of mouth.  Recommended followup with dentist for likely nightguard.  Patient is currently getting treated for left otitis media.  Ear exam appeared normal at this time, however advised patient to continue taking antibiotics until completed.        Jaci Carrel, [place] 03/28/12 [phone]

## 2012-03-28 NOTE — ED Notes (Signed)
Pt dc'd home w/all belongings and 1 new rx, driven home by friend, pt alert and ambulatory upon d/c, pt verbalizes understanding of d/c instructions

## 2012-10-21 ENCOUNTER — Encounter: Payer: Self-pay | Admitting: Obstetrics and Gynecology

## 2012-10-31 ENCOUNTER — Other Ambulatory Visit (HOSPITAL_COMMUNITY)
Admission: RE | Admit: 2012-10-31 | Discharge: 2012-10-31 | Disposition: A | Discharge status: Home / Self Care | Payer: BC Managed Care – PPO | Source: Ambulatory Visit | Attending: Obstetrics & Gynecology | Admitting: Obstetrics & Gynecology

## 2012-10-31 ENCOUNTER — Other Ambulatory Visit: Payer: Self-pay | Admitting: Obstetrics & Gynecology

## 2012-10-31 DIAGNOSIS — Z1151 Encounter for screening for human papillomavirus (HPV): Secondary | ICD-10-CM | POA: Insufficient documentation

## 2012-10-31 DIAGNOSIS — Z01419 Encounter for gynecological examination (general) (routine) without abnormal findings: Secondary | ICD-10-CM | POA: Insufficient documentation

## 2013-01-30 NOTE — L&D Delivery Note (Signed)
Delivery Note   Pt began involuntarily pushing, and had SROM, clear fluid immediately prior to delivery   At 6:12 AM a viable female was delivered via VBAC, Spontaneous (Presentation: Left Occiput ).  APGAR: 9, 9; weight 7 lb 10 oz (3459 g).   Placenta status: Intact, Spontaneous.  To pathology, ?marginal cord insertion vs. Velamentous Cord: 3 vessels with the following complications: None.  Cord pH: not collected   Anesthesia: None  Episiotomy: None Lacerations: None Suture Repair: n/a Est. Blood Loss (mL): 250  Mom to postpartum.  Baby to Couplet care / Skin to Skin.  LILLARD,SHELLEY M 07/23/2013, 3:10 PM

## 2013-02-16 ENCOUNTER — Encounter (HOSPITAL_COMMUNITY): Payer: Self-pay | Admitting: *Deleted

## 2013-02-16 ENCOUNTER — Inpatient Hospital Stay (HOSPITAL_COMMUNITY): Payer: BC Managed Care – PPO

## 2013-02-16 ENCOUNTER — Inpatient Hospital Stay (HOSPITAL_COMMUNITY)
Admission: AD | Admit: 2013-02-16 | Discharge: 2013-02-16 | Disposition: A | Discharge status: Home / Self Care | Payer: BC Managed Care – PPO | Source: Ambulatory Visit | Attending: Obstetrics and Gynecology | Admitting: Obstetrics and Gynecology

## 2013-02-16 DIAGNOSIS — W010XXA Fall on same level from slipping, tripping and stumbling without subsequent striking against object, initial encounter: Secondary | ICD-10-CM | POA: Insufficient documentation

## 2013-02-16 DIAGNOSIS — W19XXXA Unspecified fall, initial encounter: Secondary | ICD-10-CM

## 2013-02-16 DIAGNOSIS — O9989 Other specified diseases and conditions complicating pregnancy, childbirth and the puerperium: Principal | ICD-10-CM

## 2013-02-16 DIAGNOSIS — Y92009 Unspecified place in unspecified non-institutional (private) residence as the place of occurrence of the external cause: Secondary | ICD-10-CM | POA: Insufficient documentation

## 2013-02-16 DIAGNOSIS — O99891 Other specified diseases and conditions complicating pregnancy: Secondary | ICD-10-CM

## 2013-02-16 DIAGNOSIS — R1032 Left lower quadrant pain: Secondary | ICD-10-CM

## 2013-02-16 NOTE — MAU Note (Signed)
Pt presents to MAU with complaints of falling up some stairs today and landing on her abdomen. Pt denies any bleeding but states some clear discharge after she fell today.

## 2013-02-16 NOTE — Discharge Instructions (Signed)
Injuries in Pregnancy Injuries can happen during pregnancy. Minor falls and accidents usually do not harm the mother or unborn baby. However, any injury should be reported to your doctor. HOME CARE  Do not take aspirin. It can make any bleeding you may have worse.  Put ice on the injured area.  Put ice in a plastic bag.  Place a towel between your skin and the bag.  Leave the ice on for 15-20 minutes, 03-04 times a day.  Put warm packs on the injured area after 24 hours if told by your doctor.  Have someone care for you and help you if needed.  Do not wear high heels while pregnant.  Remove rugs and loose objects on the floor.  Avoid fire or starting fires.  Avoid lifting heavy pots of boiling liquid. GET HELP RIGHT AWAY IF:  You have been a victim of domestic violence.  You have been in a car accident.  You have more pain in any part of the body.  You have bleeding from your vagina.  Fluid is leaking from your vagina.  You start to have belly cramping (contractions) or pain.  You have a stiff neck or neck pain.  You feel weak or pass out (faint).  You start to throw up (vomit) after an injury.  You have been burned.  You get a headache or have vision problems after an injury.  You do not feel the baby move or the baby is not moving as much as normal. MAKE SURE YOU:  Understand these instructions.  Will watch your condition.  Will get help right away if you are not doing well or get worse. Document Released: 02/18/2010 Document Revised: 04/10/2011 Document Reviewed: 10/23/2012 [facility] Patient Information 2014 [place], [place].

## 2013-02-16 NOTE — MAU Provider Note (Signed)
History     CSN: [id number]  Arrival date and time: 02/16/13 1726   First Provider Initiated Contact with Patient 02/16/13 1801      Chief Complaint  Patient presents with  . Fall   HPI Ms. Savannah Thomas is a 33 y.o. [id number] at [redacted]w[redacted]d who presents to MAU today with complaint of a fall at home. The patient states that she slipped on the steps around 1030 am and hit her mid to lower abdomen. She feels like she may have had some LOF right after the fall. She denies vaginal bleeding. She rates her pain at 5.5/10 now. She took Tylenol at 1300 and 1700 today with little relief.   OB History   Grav Para Term Preterm Abortions TAB SAB Ect Mult Living   7 4 3 1 2 2  0   4      Past Medical History  Diagnosis Date  . Abnormal Pap smear   . Preterm labor   . Fibroids   . Chlamydia   . Candidiasis   . Asthma   . Migraines   . Hypertension     Past Surgical History  Procedure Laterality Date  . Dilation and curettage, diagnostic / therapeutic      TAB  . Dilation and curettage of uterus    . Cesarean section    . No past surgeries      Family History  Problem Relation Age of Onset  . Hypertension Mother   . Hypertension Father   . Cancer Maternal Grandmother   . Hypertension Maternal Grandmother   . Hypertension Maternal Grandfather   . Hypertension Paternal Grandmother   . Heart disease Paternal Grandfather   . Hypertension Paternal Grandfather   . Heart disease Cousin     History  Substance Use Topics  . Smoking status: Never Smoker   . Smokeless tobacco: Never Used  . Alcohol Use: No    Allergies: No Known Allergies  Prescriptions prior to admission  Medication Sig Dispense Refill  . acetaminophen (TYLENOL) 325 MG tablet Take 650 mg by mouth every 6 (six) hours as needed for moderate pain.      . Prenatal Vit-Fe Fumarate-FA (PRENATAL MULTIVITAMIN) TABS tablet Take 1 tablet by mouth daily at 12 noon.        Review of Systems  Gastrointestinal: Positive  for abdominal pain.  Genitourinary:       Neg - vaginal bleeding   Physical Exam   Blood pressure 112/63, pulse 77, temperature 98.2 F (36.8 C), temperature source Oral, resp. rate 18, height 5\' 7"  (1.702 m), weight 301 lb (136.533 kg), last menstrual period 10/01/2012.  Physical Exam  Constitutional: She is oriented to person, place, and time. She appears well-developed and well-nourished. No distress.  HENT:  Head: Normocephalic and atraumatic.  Cardiovascular: Normal rate.   Respiratory: Effort normal.  GI: Soft. Bowel sounds are normal. She exhibits no distension and no mass. There is tenderness (mild tenderness to palpation of the lower abdomen and LLQ). There is no rebound and no guarding.  Neurological: She is alert and oriented to person, place, and time.  Skin: Skin is warm and dry. No erythema.  Psychiatric: She has a normal mood and affect.    MAU Course  Procedures None  MDM Discussed with Dr. Nelda Marseille. Order [place] to confirm fluid level.  [place] preliminary report show subjectively low-normal fluid Discussed [place] results with Dr. Nelda Marseille. [place] for discharge. Follow-up in 1 week in the office Assessment and Plan  A: Fall in pregnancy  P: Discharge home Patient advised to call the office for an appointment in 1 week Patient may return to MAU as needed or if her condition were to change or worsen  Farris Has, PA-C  02/16/2013, 8:58 PM

## 2013-06-19 ENCOUNTER — Encounter (HOSPITAL_COMMUNITY): Payer: Self-pay | Admitting: *Deleted

## 2013-06-19 ENCOUNTER — Inpatient Hospital Stay (HOSPITAL_COMMUNITY)
Admission: AD | Admit: 2013-06-19 | Discharge: 2013-06-19 | Disposition: A | Discharge status: Home / Self Care | Payer: BC Managed Care – PPO | Source: Ambulatory Visit | Attending: Obstetrics & Gynecology | Admitting: Obstetrics & Gynecology

## 2013-06-19 DIAGNOSIS — N9489 Other specified conditions associated with female genital organs and menstrual cycle: Secondary | ICD-10-CM

## 2013-06-19 DIAGNOSIS — O4700 False labor before 37 completed weeks of gestation, unspecified trimester: Secondary | ICD-10-CM | POA: Insufficient documentation

## 2013-06-19 LAB — URINALYSIS, ROUTINE W REFLEX MICROSCOPIC
Bilirubin Urine: NEGATIVE
Glucose, UA: 100 mg/dL — AB
HGB URINE DIPSTICK: NEGATIVE
Ketones, ur: 15 mg/dL — AB
Leukocytes, UA: NEGATIVE
NITRITE: NEGATIVE
PROTEIN: NEGATIVE mg/dL
Specific Gravity, Urine: 1.02 (ref 1.005–1.030)
UROBILINOGEN UA: 1 mg/dL (ref 0.0–1.0)
pH: 7 (ref 5.0–8.0)

## 2013-06-19 NOTE — MAU Provider Note (Signed)
History     CSN: [id number]  Arrival date and time: 06/19/13 1030   First Provider Initiated Contact with Patient 06/19/13 1143      Chief Complaint  Patient presents with  . Labor Eval   HPI This is a 33 y.o. female at [redacted]w[redacted]d who presents with c/o   RN Note: Feeling cramping, pressure. Pain has calmed down a little now.       OB History   Grav Para Term Preterm Abortions TAB SAB Ect Mult Living   7 4 3 1 2 2  0   4      Past Medical History  Diagnosis Date  . Abnormal Pap smear   . Preterm labor   . Fibroids   . Chlamydia   . Candidiasis   . Asthma   . Migraines   . Hypertension     Past Surgical History  Procedure Laterality Date  . Dilation and curettage, diagnostic / therapeutic      TAB  . Dilation and curettage of uterus    . Cesarean section    . No past surgeries      Family History  Problem Relation Age of Onset  . Hypertension Mother   . Hypertension Father   . Cancer Maternal Grandmother   . Hypertension Maternal Grandmother   . Hypertension Maternal Grandfather   . Hypertension Paternal Grandmother   . Heart disease Paternal Grandfather   . Hypertension Paternal Grandfather   . Heart disease Cousin     History  Substance Use Topics  . Smoking status: Never Smoker   . Smokeless tobacco: Never Used  . Alcohol Use: No    Allergies: No Known Allergies  Prescriptions prior to admission  Medication Sig Dispense Refill  . Prenatal Vit-Fe Fumarate-FA (PRENATAL MULTIVITAMIN) TABS tablet Take 1 tablet by mouth daily at 12 noon.        ROS Physical Exam   Blood pressure 122/80, pulse 106, temperature 98.9 F (37.2 C), temperature source Oral, resp. rate 18, height 5' 5.5" (1.664 m), weight 138.347 kg (305 lb), last menstrual period 10/01/2012.  Physical Exam  Constitutional: She is oriented to person, place, and time. She appears well-developed and well-nourished. No distress.  HENT:  Head: Normocephalic.  Cardiovascular: Normal  rate.   Respiratory: Effort normal.  GI: Soft. There is no tenderness. There is no rebound and no guarding.  Genitourinary:  Dilation: Closed Effacement (%): 30 Station: [place] Exam by:: Carmelia Roller CNM   Musculoskeletal: Normal range of motion.  Neurological: She is alert and oriented to person, place, and time.  Skin: Skin is warm and dry.  Psychiatric: She has a normal mood and affect.   FHR reactive Irregular mild contractions  MAU Course  Procedures  MDM Results for orders placed during the hospital encounter of 06/19/13 (from the past 72 hour(s))  URINALYSIS, ROUTINE W REFLEX MICROSCOPIC     Status: Abnormal   Collection Time    06/19/13 10:45 AM      Result Value Ref Range   Color, Urine YELLOW  YELLOW   APPearance CLEAR  CLEAR   Specific Gravity, Urine 1.020  1.005 - 1.030   pH 7.0  5.0 - 8.0   Glucose, UA 100 (*) NEGATIVE mg/dL   Hgb urine dipstick NEGATIVE  NEGATIVE   Bilirubin Urine NEGATIVE  NEGATIVE   Ketones, ur 15 (*) NEGATIVE mg/dL   Protein, ur NEGATIVE  NEGATIVE mg/dL   Urobilinogen, UA 1.0  0.0 - 1.0 mg/dL  Nitrite NEGATIVE  NEGATIVE   Leukocytes, UA NEGATIVE  NEGATIVE   Comment: MICROSCOPIC NOT DONE ON URINES WITH NEGATIVE PROTEIN, BLOOD, LEUKOCYTES, NITRITE, OR GLUCOSE <1000 mg/dL.      Assessment and Plan  A:  SIUP at [redacted]w[redacted]d       Irregular contractions with no significant cervical change       FHR Category I tracing  P:  Discussed with Dr Nelda Marseille       Discharge home with PTL precautions  Seabron Spates 06/19/2013, 11:45 AM

## 2013-06-19 NOTE — Discharge Instructions (Signed)

## 2013-06-19 NOTE — MAU Note (Signed)
Pt states she is experiencing low abd cramping but doesn't feel like U/C's.  She states she is having some URQ abd pain as well.  No vaginal bleeding, abnormal discharge, or ROM.  Good fetal movement.  Pt does not appear in any distress at this time while texting on phone.

## 2013-06-19 NOTE — MAU Note (Signed)
Feeling cramping, pressure. Pain has calmed down a little now.

## 2013-07-02 ENCOUNTER — Encounter (HOSPITAL_COMMUNITY): Payer: Self-pay | Admitting: *Deleted

## 2013-07-02 ENCOUNTER — Inpatient Hospital Stay (HOSPITAL_COMMUNITY)
Admission: AD | Admit: 2013-07-02 | Discharge: 2013-07-03 | Disposition: A | Discharge status: Home / Self Care | Payer: BC Managed Care – PPO | Source: Ambulatory Visit | Attending: Obstetrics and Gynecology | Admitting: Obstetrics and Gynecology

## 2013-07-02 DIAGNOSIS — O9989 Other specified diseases and conditions complicating pregnancy, childbirth and the puerperium: Principal | ICD-10-CM

## 2013-07-02 DIAGNOSIS — O99891 Other specified diseases and conditions complicating pregnancy: Secondary | ICD-10-CM | POA: Insufficient documentation

## 2013-07-02 DIAGNOSIS — R1013 Epigastric pain: Secondary | ICD-10-CM

## 2013-07-02 LAB — COMPREHENSIVE METABOLIC PANEL
ALT: 7 U/L (ref 0–35)
AST: 16 U/L (ref 0–37)
Albumin: 2.4 g/dL — ABNORMAL LOW (ref 3.5–5.2)
Alkaline Phosphatase: 100 U/L (ref 39–117)
BUN: 9 mg/dL (ref 6–23)
CO2: 23 mEq/L (ref 19–32)
Calcium: 8.9 mg/dL (ref 8.4–10.5)
Chloride: 102 mEq/L (ref 96–112)
Creatinine, Ser: 0.54 mg/dL (ref 0.50–1.10)
GFR calc Af Amer: >90 mL/min (ref >90)
GFR calc non Af Amer: >90 mL/min (ref >90)
Glucose, Bld: 96 mg/dL (ref 70–99)
Potassium: 3.8 mEq/L (ref 3.7–5.3)
Sodium: 138 mEq/L (ref 137–147)
TOTAL PROTEIN: 6.2 g/dL (ref 6.0–8.3)
Total Bilirubin: <0.2 mg/dL — ABNORMAL LOW (ref 0.3–1.2)

## 2013-07-02 LAB — URINALYSIS, ROUTINE W REFLEX MICROSCOPIC
Bilirubin Urine: NEGATIVE
Glucose, UA: 100 mg/dL — AB
Hgb urine dipstick: NEGATIVE
KETONES UR: NEGATIVE mg/dL
LEUKOCYTES UA: NEGATIVE
Nitrite: NEGATIVE
PROTEIN: NEGATIVE mg/dL
Specific Gravity, Urine: 1.02 (ref 1.005–1.030)
Urobilinogen, UA: 1 mg/dL (ref 0.0–1.0)
pH: 6.5 (ref 5.0–8.0)

## 2013-07-02 LAB — CBC
HCT: 30.5 % — ABNORMAL LOW (ref 36.0–46.0)
Hemoglobin: 9.7 g/dL — ABNORMAL LOW (ref 12.0–15.0)
MCH: 25.3 pg — ABNORMAL LOW (ref 26.0–34.0)
MCHC: 31.8 g/dL (ref 30.0–36.0)
MCV: 79.4 fL (ref 78.0–100.0)
Platelets: 235 10*3/uL (ref 150–400)
RBC: 3.84 MIL/uL — ABNORMAL LOW (ref 3.87–5.11)
RDW: 16 % — ABNORMAL HIGH (ref 11.5–15.5)
WBC: 11.4 10*3/uL — ABNORMAL HIGH (ref 4.0–10.5)

## 2013-07-02 MED ORDER — GI COCKTAIL ~~LOC~~
30.0000 mL | Freq: Once | ORAL | Status: AC
  Administered 2013-07-02: 30 mL via ORAL
  Filled 2013-07-02: qty 30

## 2013-07-02 NOTE — MAU Provider Note (Signed)
History     CSN: [id number]  Arrival date and time: 07/02/13 2157   First Provider Initiated Contact with Patient 07/02/13 2244      Chief Complaint  Patient presents with  . Abdominal Pain  . Nausea  . Headache   HPI  Savannah Thomas is a 33 y.o. [id number] at [redacted]w[redacted]d who presents with 4-5/10 RUQ pain. She states that she has the pain off an on since yesterday. However today after having a [place] sandwich around 1800 it became much worse. She denies any current blood pressure problems or diabetes with this pregnancy. She states that that she had elevated B/P with her last pregnancy.   She states that she took tylenol and phenergan around 1845. She states that those medications helped with the pain "a little bit". She states that the baby has been moving normally. She denies ctx, VB or LOF.   She sees Dr. Nelda Marseille for prenatal care.   Past Medical History  Diagnosis Date  . Abnormal Pap smear   . Preterm labor   . Fibroids   . Chlamydia   . Candidiasis   . Asthma   . Migraines   . Hypertension     Past Surgical History  Procedure Laterality Date  . Dilation and curettage, diagnostic / therapeutic      TAB  . Dilation and curettage of uterus    . Cesarean section    . No past surgeries      Family History  Problem Relation Age of Onset  . Hypertension Mother   . Hypertension Father   . Cancer Maternal Grandmother   . Hypertension Maternal Grandmother   . Hypertension Maternal Grandfather   . Hypertension Paternal Grandmother   . Heart disease Paternal Grandfather   . Hypertension Paternal Grandfather   . Heart disease Cousin     History  Substance Use Topics  . Smoking status: Never Smoker   . Smokeless tobacco: Never Used  . Alcohol Use: No    Allergies: No Known Allergies  Prescriptions prior to admission  Medication Sig Dispense Refill  . Prenatal Vit-Fe Fumarate-FA (PRENATAL MULTIVITAMIN) TABS tablet Take 1 tablet by mouth daily at 12 noon.         ROS Physical Exam   Height 5' 7.5" (1.715 m), weight 141.069 kg (311 lb), last menstrual period 10/01/2012.  Physical Exam  Nursing note and vitals reviewed. Constitutional: She is oriented to person, place, and time. She appears well-developed and well-nourished. No distress.  Cardiovascular: Normal rate.   Respiratory: Effort normal.  GI: Soft. There is tenderness (thenderness in the epigastric area. No tenderenss in the RUQ ). There is no rebound and no guarding.  Neurological: She is alert and oriented to person, place, and time.  Skin: Skin is warm and dry.  Psychiatric: She has a normal mood and affect.   FHT: 135, moderate with 15x15 accels, no decels Toco: no UCs MAU Course  Procedures  Results for orders placed during the hospital encounter of 07/02/13 (from the past 24 hour(s))  CBC     Status: Abnormal   Collection Time    07/02/13 10:50 PM      Result Value Ref Range   WBC 11.4 (*) 4.0 - 10.5 K/uL   RBC 3.84 (*) 3.87 - 5.11 MIL/uL   Hemoglobin 9.7 (*) 12.0 - 15.0 g/dL   HCT 30.5 (*) 36.0 - 46.0 %   MCV 79.4  78.0 - 100.0 fL   MCH 25.3 (*) 26.0 -  34.0 pg   MCHC 31.8  30.0 - 36.0 g/dL   RDW 16.0 (*) 11.5 - 15.5 %   Platelets 235  150 - 400 K/uL  COMPREHENSIVE METABOLIC PANEL     Status: Abnormal   Collection Time    07/02/13 10:50 PM      Result Value Ref Range   Sodium 138  137 - 147 mEq/L   Potassium 3.8  3.7 - 5.3 mEq/L   Chloride 102  96 - 112 mEq/L   CO2 23  19 - 32 mEq/L   Glucose, Bld 96  70 - 99 mg/dL   BUN 9  6 - 23 mg/dL   Creatinine, Ser 0.54  0.50 - 1.10 mg/dL   Calcium 8.9  8.4 - 10.5 mg/dL   Total Protein 6.2  6.0 - 8.3 g/dL   Albumin 2.4 (*) 3.5 - 5.2 g/dL   AST 16  0 - 37 U/L   ALT 7  0 - 35 U/L   Alkaline Phosphatase 100  39 - 117 U/L   Total Bilirubin <0.2 (*) 0.3 - 1.2 mg/dL   GFR calc non Af Amer >90  >90 mL/min   GFR calc Af Amer >90  >90 mL/min     2302: D/W Dr. Simona Huh, if labs come back normal and GI cocktail helps the  pain then the patient may be DC home.  2345: Patient reports some improvement with GI cocktail.  Assessment and Plan   1. Epigastric pain    Labs normal Pain is more epigastric than RUQ Consider outpatient Abd [place] if pain persists Amylase/Lipase pending  PTL precautions Fetal kick counts Return to MAU as needed  Follow-up Information   Follow up with [facility]. (As scheduled)    Specialty:  Obstetrics and Gynecology   Contact information:   [address] [place] [address] [phone]        Mathis Bud 07/02/2013, 10:45 PM

## 2013-07-02 NOTE — MAU Note (Signed)
Pt states she is having pain in the upper right quadrant of her abdomen today and pt states she also has a headache.Patient denies having this type of pain before. Pt states she took tylenol and phenergan earlier that helped a little

## 2013-07-02 NOTE — Discharge Instructions (Signed)
Third Trimester of Pregnancy °The third trimester is from week 29 through week 42, months 7 through 9. The third trimester is a time when the fetus is growing rapidly. At the end of the ninth month, the fetus is about 20 inches in length and weighs 6 10 pounds.  °BODY CHANGES °Your body goes through many changes during pregnancy. The changes vary from woman to woman.  °· Your weight will continue to increase. You can expect to gain 25 35 pounds (11 16 kg) by the end of the pregnancy. °· You may begin to get stretch marks on your hips, abdomen, and breasts. °· You may urinate more often because the fetus is moving lower into your pelvis and pressing on your bladder. °· You may develop or continue to have heartburn as a result of your pregnancy. °· You may develop constipation because certain hormones are causing the muscles that push waste through your intestines to slow down. °· You may develop hemorrhoids or swollen, bulging veins (varicose veins). °· You may have pelvic pain because of the weight gain and pregnancy hormones relaxing your joints between the bones in your pelvis. Back aches may result from over exertion of the muscles supporting your posture. °· Your breasts will continue to grow and be tender. A yellow discharge may leak from your breasts called colostrum. °· Your belly button may stick out. °· You may feel short of breath because of your expanding uterus. °· You may notice the fetus "dropping," or moving lower in your abdomen. °· You may have a bloody mucus discharge. This usually occurs a few days to a week before labor begins. °· Your cervix becomes thin and soft (effaced) near your due date. °WHAT TO EXPECT AT YOUR PRENATAL EXAMS  °You will have prenatal exams every 2 weeks until week 36. Then, you will have weekly prenatal exams. During a routine prenatal visit: °· You will be weighed to make sure you and the fetus are growing normally. °· Your blood pressure is taken. °· Your abdomen will be  measured to track your baby's growth. °· The fetal heartbeat will be listened to. °· Any test results from the previous visit will be discussed. °· You may have a cervical check near your due date to see if you have effaced. °At around 36 weeks, your caregiver will check your cervix. At the same time, your caregiver will also perform a test on the secretions of the vaginal tissue. This test is to determine if a type of bacteria, Group B streptococcus, is present. Your caregiver will explain this further. °Your caregiver may ask you: °· What your birth plan is. °· How you are feeling. °· If you are feeling the baby move. °· If you have had any abnormal symptoms, such as leaking fluid, bleeding, severe headaches, or abdominal cramping. °· If you have any questions. °Other tests or screenings that may be performed during your third trimester include: °· Blood tests that check for low iron levels (anemia). °· Fetal testing to check the health, activity level, and growth of the fetus. Testing is done if you have certain medical conditions or if there are problems during the pregnancy. °FALSE LABOR °You may feel small, irregular contractions that eventually go away. These are called Braxton Hicks contractions, or false labor. Contractions may last for hours, days, or even weeks before true labor sets in. If contractions come at regular intervals, intensify, or become painful, it is best to be seen by your caregiver.  °  SIGNS OF LABOR  °· Menstrual-like cramps. °· Contractions that are 5 minutes apart or less. °· Contractions that start on the top of the uterus and spread down to the lower abdomen and back. °· A sense of increased pelvic pressure or back pain. °· A watery or bloody mucus discharge that comes from the vagina. °If you have any of these signs before the 37th week of pregnancy, call your caregiver right away. You need to go to the hospital to get checked immediately. °HOME CARE INSTRUCTIONS  °· Avoid all  smoking, herbs, alcohol, and unprescribed drugs. These chemicals affect the formation and growth of the baby. °· Follow your caregiver's instructions regarding medicine use. There are medicines that are either safe or unsafe to take during pregnancy. °· Exercise only as directed by your caregiver. Experiencing uterine cramps is a good sign to stop exercising. °· Continue to eat regular, healthy meals. °· Wear a good support bra for breast tenderness. °· Do not use hot tubs, steam rooms, or saunas. °· Wear your seat belt at all times when driving. °· Avoid raw meat, uncooked cheese, cat litter boxes, and soil used by cats. These carry germs that can cause birth defects in the baby. °· Take your prenatal vitamins. °· Try taking a stool softener (if your caregiver approves) if you develop constipation. Eat more high-fiber foods, such as fresh vegetables or fruit and whole grains. Drink plenty of fluids to keep your urine clear or pale yellow. °· Take warm sitz baths to soothe any pain or discomfort caused by hemorrhoids. Use hemorrhoid cream if your caregiver approves. °· If you develop varicose veins, wear support hose. Elevate your feet for 15 minutes, 3 4 times a day. Limit salt in your diet. °· Avoid heavy lifting, wear low heal shoes, and practice good posture. °· Rest a lot with your legs elevated if you have leg cramps or low back pain. °· Visit your dentist if you have not gone during your pregnancy. Use a soft toothbrush to brush your teeth and be gentle when you floss. °· A sexual relationship may be continued unless your caregiver directs you otherwise. °· Do not travel far distances unless it is absolutely necessary and only with the approval of your caregiver. °· Take prenatal classes to understand, practice, and ask questions about the labor and delivery. °· Make a trial run to the hospital. °· Pack your hospital bag. °· Prepare the baby's nursery. °· Continue to go to all your prenatal visits as directed  by your caregiver. °SEEK MEDICAL CARE IF: °· You are unsure if you are in labor or if your water has broken. °· You have dizziness. °· You have mild pelvic cramps, pelvic pressure, or nagging pain in your abdominal area. °· You have persistent nausea, vomiting, or diarrhea. °· You have a bad smelling vaginal discharge. °· You have pain with urination. °SEEK IMMEDIATE MEDICAL CARE IF:  °· You have a fever. °· You are leaking fluid from your vagina. °· You have spotting or bleeding from your vagina. °· You have severe abdominal cramping or pain. °· You have rapid weight loss or gain. °· You have shortness of breath with chest pain. °· You notice sudden or extreme swelling of your face, hands, ankles, feet, or legs. °· You have not felt your baby move in over an hour. °· You have severe headaches that do not go away with medicine. °· You have vision changes. °Document Released: 01/10/2001 Document Revised: 09/18/2012 Document Reviewed:   You have severe abdominal cramping or pain.   You have rapid weight loss or gain.   You have shortness of breath with chest pain.   You notice sudden or extreme swelling of your face, hands, ankles, feet, or legs.   You have not felt your baby move in over an hour.   You have severe headaches that do not go away with medicine.   You have vision changes.  Document Released: 01/10/2001 Document Revised: 09/18/2012 Document Reviewed: 03/19/2012  ExitCare Patient Information 2014 [employer].

## 2013-07-03 LAB — AMYLASE: AMYLASE: 81 U/L (ref 0–105)

## 2013-07-03 LAB — LIPASE, BLOOD: Lipase: 31 U/L (ref 11–59)

## 2013-07-12 LAB — OB RESULTS CONSOLE GBS: GBS: NEGATIVE

## 2013-07-16 ENCOUNTER — Inpatient Hospital Stay (HOSPITAL_COMMUNITY): Payer: BC Managed Care – PPO

## 2013-07-16 ENCOUNTER — Inpatient Hospital Stay (HOSPITAL_COMMUNITY)
Admission: AD | Admit: 2013-07-16 | Discharge: 2013-07-16 | Disposition: A | Discharge status: Home / Self Care | Payer: BC Managed Care – PPO | Source: Ambulatory Visit | Attending: Obstetrics & Gynecology | Admitting: Obstetrics & Gynecology

## 2013-07-16 ENCOUNTER — Encounter (HOSPITAL_COMMUNITY): Payer: Self-pay | Admitting: *Deleted

## 2013-07-16 DIAGNOSIS — O99891 Other specified diseases and conditions complicating pregnancy: Secondary | ICD-10-CM | POA: Insufficient documentation

## 2013-07-16 DIAGNOSIS — O26899 Other specified pregnancy related conditions, unspecified trimester: Secondary | ICD-10-CM

## 2013-07-16 DIAGNOSIS — N949 Unspecified condition associated with female genital organs and menstrual cycle: Secondary | ICD-10-CM | POA: Insufficient documentation

## 2013-07-16 DIAGNOSIS — K219 Gastro-esophageal reflux disease without esophagitis: Secondary | ICD-10-CM | POA: Insufficient documentation

## 2013-07-16 DIAGNOSIS — R109 Unspecified abdominal pain: Secondary | ICD-10-CM | POA: Insufficient documentation

## 2013-07-16 DIAGNOSIS — O9989 Other specified diseases and conditions complicating pregnancy, childbirth and the puerperium: Principal | ICD-10-CM

## 2013-07-16 LAB — COMPREHENSIVE METABOLIC PANEL
ALK PHOS: 129 U/L — AB (ref 39–117)
ALT: 7 U/L (ref 0–35)
AST: 18 U/L (ref 0–37)
Albumin: 2.4 g/dL — ABNORMAL LOW (ref 3.5–5.2)
BUN: 6 mg/dL (ref 6–23)
CO2: 21 mEq/L (ref 19–32)
Calcium: 8.9 mg/dL (ref 8.4–10.5)
Chloride: 103 mEq/L (ref 96–112)
Creatinine, Ser: 0.55 mg/dL (ref 0.50–1.10)
GFR calc non Af Amer: >90 mL/min (ref >90)
GLUCOSE: 92 mg/dL (ref 70–99)
Potassium: 3.8 mEq/L (ref 3.7–5.3)
Sodium: 135 mEq/L — ABNORMAL LOW (ref 137–147)
TOTAL PROTEIN: 6.8 g/dL (ref 6.0–8.3)
Total Bilirubin: <0.2 mg/dL — ABNORMAL LOW (ref 0.3–1.2)

## 2013-07-16 LAB — CBC
HCT: 31.9 % — ABNORMAL LOW (ref 36.0–46.0)
HEMOGLOBIN: 10.1 g/dL — AB (ref 12.0–15.0)
MCH: 24.7 pg — ABNORMAL LOW (ref 26.0–34.0)
MCHC: 31.7 g/dL (ref 30.0–36.0)
MCV: 78 fL (ref 78.0–100.0)
Platelets: 248 10*3/uL (ref 150–400)
RBC: 4.09 MIL/uL (ref 3.87–5.11)
RDW: 15.9 % — ABNORMAL HIGH (ref 11.5–15.5)
WBC: 9.7 10*3/uL (ref 4.0–10.5)

## 2013-07-16 LAB — LIPASE, BLOOD: Lipase: 26 U/L (ref 11–59)

## 2013-07-16 LAB — AMYLASE: Amylase: 81 U/L (ref 0–105)

## 2013-07-16 MED ORDER — PANTOPRAZOLE SODIUM 20 MG PO TBEC: 20.0000 mg | DELAYED_RELEASE_TABLET | Freq: Every day | ORAL | Status: DC

## 2013-07-16 NOTE — Discharge Instructions (Signed)
Heartburn During Pregnancy  °Heartburn is a burning sensation in the chest caused by stomach acid backing up into the esophagus. Heartburn is common in pregnancy because a certain hormone (progesterone) is released when a woman is pregnant. The progesterone hormone may relax the valve that separates the esophagus from the stomach. This allows acid to go up into the esophagus, causing heartburn. Heartburn may also happen in pregnancy because the enlarging uterus pushes up on the stomach, which pushes more acid into the esophagus. This is especially true in the later stages of pregnancy. Heartburn problems usually go away after giving birth. °CAUSES  °Heartburn is caused by stomach acid backing up into the esophagus. During pregnancy, this may result from various things, including:  °· The progesterone hormone. °· Changing hormone levels. °· The growing uterus pushing stomach acid upward. °· Large meals. °· Certain foods and drinks. °· Exercise. °· Increased acid production. °SIGNS AND SYMPTOMS  °· Burning pain in the chest or lower throat. °· Bitter taste in the mouth. °· Coughing. °DIAGNOSIS  °Your health care provider will typically diagnose heartburn by taking a careful history of your concern. Blood tests may be done to check for a certain type of bacteria that is associated with heartburn. Sometimes, heartburn is diagnosed by prescribing a heartburn medicine to see if the symptoms improve. In some cases, a procedure called an endoscopy may be done. In this procedure, a tube with a light and a camera on the end (endoscope) is used to examine the esophagus and the stomach. °TREATMENT  °Treatment will vary depending on the severity of your symptoms. Your health care provider may recommend: °· Over-the-counter medicines (antacids, acid reducers) for mild heartburn. °· Prescription medicines to decrease stomach acid or to protect your stomach lining. °· Certain changes in your diet. °· Elevating the head of your bed  by putting blocks under the legs. This helps prevent stomach acid from backing up into the esophagus when you are lying down. °HOME CARE INSTRUCTIONS  °· Only take over-the-counter or prescription medicines as directed by your health care provider. °· Raise the head of your bed by putting blocks under the legs if instructed to do so by your health care provider. Sleeping with more pillows is not effective because it only changes the position of your head. °· Do not exercise right after eating. °· Avoid eating 2-3 hours before bed. Do not lie down right after eating. °· Eat small meals throughout the day instead of three large meals. °· Identify foods and beverages that make your symptoms worse and avoid them. Foods you may want to avoid include: °¨ Peppers. °¨ Chocolate. °¨ High-fat foods, including fried foods. °¨ Spicy foods. °¨ Garlic and onions. °¨ Citrus fruits, including oranges, grapefruit, lemons, and limes. °¨ Food containing tomatoes or tomato products. °¨ Mint. °¨ Carbonated and caffeinated drinks. °¨ Vinegar. °SEEK MEDICAL CARE IF: °· You have abdominal pain of any kind. °· You feel burning in your upper abdomen or chest, especially after eating or lying down. °· You have nausea and vomiting. °· Your stomach feels upset after you eat. °SEEK IMMEDIATE MEDICAL CARE IF:  °· You have severe chest pain that goes down your arm or into your jaw or neck. °· You feel sweaty, dizzy, or light-headed. °· You become short of breath. °· You vomit blood. °· You have difficulty or pain with swallowing. °· You have bloody or black, tarry stools. °· You have episodes of heartburn more than 3 times a   week, for more than 2 weeks. °MAKE SURE YOU: °· Understand these instructions. °· Will watch your condition. °· Will get help right away if you are not doing well or get worse. °Document Released: 01/14/2000 Document Revised: 01/21/2013 Document Reviewed: 09/04/2012 °ExitCare® Patient Information ©2015 [employer]. This  information is not intended to replace advice given to you by your health care provider. Make sure you discuss any questions you have with your health care provider. ° °

## 2013-07-16 NOTE — MAU Provider Note (Signed)
History     CSN: [id number]  Arrival date and time: 07/16/13 1242   First Provider Initiated Contact with Patient 07/16/13 1308      Chief Complaint  Patient presents with  . Abdominal Pain  . Pelvic Pain   HPI This is a 33 y.o. female at 62w1dwho presents with c/o upper abdominal pain that is similar to what she had before. Some acid reflux with it. No vomiting. No pain in lower abdomen. Does have afew contractions.   RN Note:  Pain in upper abd, constant, started up again yesterday. Increased pelvic pressure and some contractions.       OB History   Grav Para Term Preterm Abortions TAB SAB Ect Mult Living   7 4 3 1 2 2  0   4      Past Medical History  Diagnosis Date  . Abnormal Pap smear   . Preterm labor   . Fibroids   . Chlamydia   . Candidiasis   . Asthma   . Migraines   . Hypertension     Past Surgical History  Procedure Laterality Date  . Dilation and curettage, diagnostic / therapeutic      TAB  . Dilation and curettage of uterus    . Cesarean section    . No past surgeries      Family History  Problem Relation Age of Onset  . Hypertension Mother   . Hypertension Father   . Cancer Maternal Grandmother   . Hypertension Maternal Grandmother   . Hypertension Maternal Grandfather   . Hypertension Paternal Grandmother   . Heart disease Paternal Grandfather   . Hypertension Paternal Grandfather   . Heart disease Cousin     History  Substance Use Topics  . Smoking status: Never Smoker   . Smokeless tobacco: Never Used  . Alcohol Use: No    Allergies: No Known Allergies  Prescriptions prior to admission  Medication Sig Dispense Refill  . Prenatal Vit-Fe Fumarate-FA (PRENATAL MULTIVITAMIN) TABS tablet Take 1 tablet by mouth daily at 12 noon.        Review of Systems  Constitutional: Negative for fever, chills and malaise/fatigue.  Gastrointestinal: Positive for heartburn and abdominal pain. Negative for nausea, vomiting, diarrhea and  constipation.  Genitourinary: Negative for dysuria.  Neurological: Negative for dizziness.   Physical Exam   Blood pressure 140/82, pulse 87, temperature 99.1 F (37.3 C), temperature source Oral, resp. rate 20, height 5' 5.5" (1.664 m), weight 141.069 kg (311 lb), last menstrual period 10/01/2012.  Physical Exam  Constitutional: She is oriented to person, place, and time. She appears well-developed and well-nourished. No distress.  Cardiovascular: Normal rate.   Respiratory: Effort normal.  GI: Soft. She exhibits no distension and no mass. There is tenderness (slight epigastric). There is no rebound and no guarding.  Genitourinary: Vagina normal and uterus normal. No vaginal discharge found.  Dilation: 1 Effacement (%): 50 Cervical Position: Posterior Exam by:: CNoel GeroldRN   Musculoskeletal: Normal range of motion.  Neurological: She is alert and oriented to person, place, and time.  Skin: Skin is warm and dry.  Psychiatric: She has a normal mood and affect.   FHR reactive Irregular contractions  Results for orders placed during the hospital encounter of 07/16/13 (from the past 72 hour(s))  CBC     Status: Abnormal   Collection Time    07/16/13  1:05 PM      Result Value Ref Range   WBC 9.7  4.0 - 10.5 K/uL   RBC 4.09  3.87 - 5.11 MIL/uL   Hemoglobin 10.1 (*) 12.0 - 15.0 g/dL   HCT 31.9 (*) 36.0 - 46.0 %   MCV 78.0  78.0 - 100.0 fL   MCH 24.7 (*) 26.0 - 34.0 pg   MCHC 31.7  30.0 - 36.0 g/dL   RDW 15.9 (*) 11.5 - 15.5 %   Platelets 248  150 - 400 K/uL  COMPREHENSIVE METABOLIC PANEL     Status: Abnormal   Collection Time    07/16/13  1:05 PM      Result Value Ref Range   Sodium 135 (*) 137 - 147 mEq/L   Potassium 3.8  3.7 - 5.3 mEq/L   Chloride 103  96 - 112 mEq/L   CO2 21  19 - 32 mEq/L   Glucose, Bld 92  70 - 99 mg/dL   BUN 6  6 - 23 mg/dL   Creatinine, Ser 0.55  0.50 - 1.10 mg/dL   Calcium 8.9  8.4 - 10.5 mg/dL   Total Protein 6.8  6.0 - 8.3 g/dL   Albumin  2.4 (*) 3.5 - 5.2 g/dL   AST 18  0 - 37 U/L   ALT 7  0 - 35 U/L   Alkaline Phosphatase 129 (*) 39 - 117 U/L   Total Bilirubin <0.2 (*) 0.3 - 1.2 mg/dL   GFR calc non Af Amer >90  >90 mL/min   GFR calc Af Amer >90  >90 mL/min   Comment: (NOTE)     The eGFR has been calculated using the CKD EPI equation.     This calculation has not been validated in all clinical situations.     eGFR's persistently <90 mL/min signify possible Chronic Kidney     Disease.  AMYLASE     Status: None   Collection Time    07/16/13  1:05 PM      Result Value Ref Range   Amylase 81  0 - 105 U/L   Comment: Performed at [place], BLOOD     Status: None   Collection Time    07/16/13  1:05 PM      Result Value Ref Range   Lipase 26  11 - 59 U/L   Comment: Performed at [facility]    MAU Course  Procedures  MDM Results reviewed with Dr Nelda Marseille Recommend PPI rx and discharge home. Declines GI cocktail   Assessment and Plan  A: SIUP at [redacted]w[redacted]d      GERD, probable      Not in labor  P;  Declines GI cocktail      Rx protonix      Followup in office      Labor precautions        [facility]6/[facility]/2015, 4:52 PM

## 2013-07-16 NOTE — MAU Note (Signed)
Pain in upper abd, constant, started up again yesterday. Increased pelvic pressure and some contractions.

## 2013-07-22 ENCOUNTER — Encounter (HOSPITAL_COMMUNITY): Payer: Self-pay | Admitting: General Practice

## 2013-07-22 ENCOUNTER — Inpatient Hospital Stay (HOSPITAL_COMMUNITY)
Admission: AD | Admit: 2013-07-22 | Discharge: 2013-07-23 | DRG: 775 | Disposition: A | Discharge status: Home / Self Care | Payer: BC Managed Care – PPO | Source: Ambulatory Visit | Attending: Obstetrics & Gynecology | Admitting: Obstetrics & Gynecology

## 2013-07-22 ENCOUNTER — Inpatient Hospital Stay (HOSPITAL_COMMUNITY): Payer: BC Managed Care – PPO

## 2013-07-22 DIAGNOSIS — E669 Obesity, unspecified: Secondary | ICD-10-CM | POA: Diagnosis present

## 2013-07-22 DIAGNOSIS — Z6841 Body Mass Index (BMI) 40.0 and over, adult: Secondary | ICD-10-CM

## 2013-07-22 DIAGNOSIS — O99214 Obesity complicating childbirth: Secondary | ICD-10-CM

## 2013-07-22 DIAGNOSIS — O34219 Maternal care for unspecified type scar from previous cesarean delivery: Principal | ICD-10-CM | POA: Diagnosis present

## 2013-07-22 LAB — CBC
HCT: 35.5 % — ABNORMAL LOW (ref 36.0–46.0)
Hemoglobin: 11.4 g/dL — ABNORMAL LOW (ref 12.0–15.0)
MCH: 25 pg — AB (ref 26.0–34.0)
MCHC: 32.1 g/dL (ref 30.0–36.0)
MCV: 77.9 fL — ABNORMAL LOW (ref 78.0–100.0)
PLATELETS: 253 10*3/uL (ref 150–400)
RBC: 4.56 MIL/uL (ref 3.87–5.11)
RDW: 16.3 % — ABNORMAL HIGH (ref 11.5–15.5)
WBC: 14.3 10*3/uL — ABNORMAL HIGH (ref 4.0–10.5)

## 2013-07-22 LAB — TYPE AND SCREEN
ABO/RH(D): A POS
Antibody Screen: NEGATIVE

## 2013-07-22 LAB — RPR

## 2013-07-22 MED ORDER — SIMETHICONE 80 MG PO CHEW: 80.0000 mg | CHEWABLE_TABLET | ORAL | Status: DC | PRN

## 2013-07-22 MED ORDER — SENNOSIDES-DOCUSATE SODIUM 8.6-50 MG PO TABS
2.0000 | ORAL_TABLET | ORAL | Status: DC
  Administered 2013-07-22: 2 via ORAL
  Filled 2013-07-22: qty 2

## 2013-07-22 MED ORDER — OXYCODONE-ACETAMINOPHEN 5-325 MG PO TABS: 1.0000 | ORAL_TABLET | ORAL | Status: DC | PRN

## 2013-07-22 MED ORDER — ACETAMINOPHEN 325 MG PO TABS: 650.0000 mg | ORAL_TABLET | ORAL | Status: DC | PRN

## 2013-07-22 MED ORDER — OXYCODONE-ACETAMINOPHEN 5-325 MG PO TABS
1.0000 | ORAL_TABLET | ORAL | Status: DC | PRN
  Administered 2013-07-22: 2 via ORAL
  Administered 2013-07-22 (×2): 1 via ORAL
  Filled 2013-07-22: qty 2
  Filled 2013-07-22 (×2): qty 1

## 2013-07-22 MED ORDER — FENTANYL CITRATE 0.05 MG/ML IJ SOLN
INTRAMUSCULAR | Status: AC
  Filled 2013-07-22: qty 2

## 2013-07-22 MED ORDER — OXYTOCIN 40 UNITS IN LACTATED RINGERS INFUSION - SIMPLE MED: 62.5000 mL/h | INTRAVENOUS | Status: DC

## 2013-07-22 MED ORDER — ONDANSETRON HCL 4 MG/2ML IJ SOLN: 4.0000 mg | Freq: Four times a day (QID) | INTRAMUSCULAR | Status: DC | PRN

## 2013-07-22 MED ORDER — LIDOCAINE HCL (PF) 1 % IJ SOLN
INTRAMUSCULAR | Status: AC
  Filled 2013-07-22: qty 30

## 2013-07-22 MED ORDER — ONDANSETRON HCL 4 MG PO TABS: 4.0000 mg | ORAL_TABLET | ORAL | Status: DC | PRN

## 2013-07-22 MED ORDER — DIPHENHYDRAMINE HCL 25 MG PO CAPS: 25.0000 mg | ORAL_CAPSULE | Freq: Four times a day (QID) | ORAL | Status: DC | PRN

## 2013-07-22 MED ORDER — LACTATED RINGERS IV SOLN: 500.0000 mL | INTRAVENOUS | Status: DC | PRN

## 2013-07-22 MED ORDER — LANOLIN HYDROUS EX OINT: TOPICAL_OINTMENT | CUTANEOUS | Status: DC | PRN

## 2013-07-22 MED ORDER — OXYTOCIN 40 UNITS IN LACTATED RINGERS INFUSION - SIMPLE MED
INTRAVENOUS | Status: AC
  Administered 2013-07-22: 40 [IU]
  Filled 2013-07-22: qty 1000

## 2013-07-22 MED ORDER — LIDOCAINE HCL (PF) 1 % IJ SOLN
30.0000 mL | INTRAMUSCULAR | Status: DC | PRN
  Filled 2013-07-22: qty 30

## 2013-07-22 MED ORDER — PRENATAL MULTIVITAMIN CH
1.0000 | ORAL_TABLET | Freq: Every day | ORAL | Status: DC
  Administered 2013-07-22 – 2013-07-23 (×2): 1 via ORAL
  Filled 2013-07-22 (×2): qty 1

## 2013-07-22 MED ORDER — BENZOCAINE-MENTHOL 20-0.5 % EX AERO: 1.0000 "application " | INHALATION_SPRAY | CUTANEOUS | Status: DC | PRN

## 2013-07-22 MED ORDER — LACTATED RINGERS IV SOLN: INTRAVENOUS | Status: DC

## 2013-07-22 MED ORDER — OXYTOCIN BOLUS FROM INFUSION: 500.0000 mL | INTRAVENOUS | Status: DC

## 2013-07-22 MED ORDER — ZOLPIDEM TARTRATE 5 MG PO TABS: 5.0000 mg | ORAL_TABLET | Freq: Every evening | ORAL | Status: DC | PRN

## 2013-07-22 MED ORDER — DIBUCAINE 1 % RE OINT: 1.0000 "application " | TOPICAL_OINTMENT | RECTAL | Status: DC | PRN

## 2013-07-22 MED ORDER — IBUPROFEN 600 MG PO TABS
600.0000 mg | ORAL_TABLET | Freq: Four times a day (QID) | ORAL | Status: DC
  Administered 2013-07-22 – 2013-07-23 (×5): 600 mg via ORAL
  Filled 2013-07-22 (×5): qty 1

## 2013-07-22 MED ORDER — FENTANYL CITRATE 0.05 MG/ML IJ SOLN
100.0000 ug | INTRAMUSCULAR | Status: DC | PRN
  Administered 2013-07-22: 100 ug via INTRAVENOUS

## 2013-07-22 MED ORDER — CITRIC ACID-SODIUM CITRATE 334-500 MG/5ML PO SOLN: 30.0000 mL | ORAL | Status: DC | PRN

## 2013-07-22 MED ORDER — WITCH HAZEL-GLYCERIN EX PADS: 1.0000 "application " | MEDICATED_PAD | CUTANEOUS | Status: DC | PRN

## 2013-07-22 MED ORDER — ONDANSETRON HCL 4 MG/2ML IJ SOLN
4.0000 mg | INTRAMUSCULAR | Status: DC | PRN
Start: 2013-07-22 — End: 2013-07-23

## 2013-07-22 MED ORDER — IBUPROFEN 600 MG PO TABS: 600.0000 mg | ORAL_TABLET | Freq: Four times a day (QID) | ORAL | Status: DC | PRN

## 2013-07-22 NOTE — Lactation Note (Signed)
This note was copied from the chart of Savannah Thomas. Lactation Consultation Note  Patient Name: Savannah Thomas Date: 07/22/2013 Reason for consult: Initial assessment of this multipara and her newborn, now 5 hours postpartum. Mom states she breastfed all other babies "as long as I produced milk" and the youngest nursed almost one year.  Mom insists that she gave formula by bottle to other babies and wants to do that with her newborn.  RN, Sharyn Lull and [facility] both cautioned her concerning how this can effect successful breastfeeding and milk production   Initial LATCH score=8 after delivery.  Mom states that she knows how to hand express her colostrum.  LC encouraged frequent STS and cue feedings and reviewed small newborn stomach size and need for mom's rich colostrum whenever feeding cues noted.  Mom encouraged to feed baby 8-12 times/24 hours and with feeding cues. LC encouraged review of Baby and Me pp 9, 14 and 20-25 for STS and BF information. LC provided [employer] brochure and reviewed [facility] services and list of community and web site resources.    Maternal Data Formula Feeding for Exclusion: No (mom now states she plans to also give formula by bottle) Infant to breast within first hour of birth: Yes (initial LATCH score=8 and baby breastfed for 10 minutes) Has patient been taught Hand Expression?: Yes (mom states that she knows how to hand express drops of colostrum) Does the patient have breastfeeding experience prior to this delivery?: Yes  Feeding Feeding Type: Breast Fed Length of feed: 10 min  LATCH Score/Interventions Latch: Repeated attempts needed to sustain latch, nipple held in mouth throughout feeding, stimulation needed to elicit sucking reflex.  Audible Swallowing: A few with stimulation Intervention(s): Skin to skin  Type of Nipple: Everted at rest and after stimulation  Comfort (Breast/Nipple): Soft / non-tender     Hold (Positioning): No assistance  needed to correctly position infant at breast.  LATCH Score: 8 (previous feeding assessment, by RN)  Lactation Tools Discussed/Used   STS, cue feedings, hand expression  Consult Status Consult Status: Follow-up Date: 07/23/13 Follow-up type: In-patient    Junious Dresser [facility] 07/22/2013, 6:13 PM

## 2013-07-22 NOTE — MAU Note (Signed)
PT WAS IN LOBBY -  VERY UNCOMFORTABLE-  REFUSED W/C.   TO ROOM 10-  DIFFICULTY TO GET IN BED -   FHR-130-  VE 8 CM.  TRANSFER TO RM  167 VIA STRETCHER

## 2013-07-23 LAB — CBC
HCT: 31.1 % — ABNORMAL LOW (ref 36.0–46.0)
Hemoglobin: 9.8 g/dL — ABNORMAL LOW (ref 12.0–15.0)
MCH: 24.6 pg — AB (ref 26.0–34.0)
MCHC: 31.5 g/dL (ref 30.0–36.0)
MCV: 78.1 fL (ref 78.0–100.0)
PLATELETS: 212 10*3/uL (ref 150–400)
RBC: 3.98 MIL/uL (ref 3.87–5.11)
RDW: 16.2 % — ABNORMAL HIGH (ref 11.5–15.5)
WBC: 15.1 10*3/uL — ABNORMAL HIGH (ref 4.0–10.5)

## 2013-07-23 MED ORDER — IBUPROFEN 600 MG PO TABS: 600.0000 mg | ORAL_TABLET | Freq: Four times a day (QID) | ORAL | Status: DC

## 2013-07-23 NOTE — Progress Notes (Signed)
Postpartum Note Day # 1  S:  Patient resting comfortable in bed.  Pain controlled.  Tolerating general. + flatus, no BM.  Lochia moderate.  Ambulating without difficulty.  She denies n/v/f/c, SOB, or CP.  Pt breastfeeding without difficulty.  O: Temp:  [97.3 F (36.3 C)-98.7 F (37.1 C)] 97.3 F (36.3 C) (06/24 0540) Pulse Rate:  [67-127] 81 (06/24 0540) Resp:  [16-20] 20 (06/24 0540) BP: (100-145)/(57-97) 128/85 mmHg (06/24 0540) Gen: A&Ox3, NAD CV: RRR, no MRG Resp: CTAB Abdomen: obese, soft, NT, ND Uterus: firm, non-tender, below umbilicus Ext: No edema, no calf tenderness bilaterally, SCDs at bedside  Labs:  Recent Labs  07/22/13 0730 07/23/13 0620  HGB 11.4* 9.8*    A/P: Pt is a 33 y.o. [id number] s/p NSVD PPD#1  - Pain well controlled -GU: voiding freely -GI: Tolerating general diet -Activity: encouraged sitting up to chair and ambulation as tolerated -Prophylaxis: early ambulation -Labs: stable as above Pt breastfeeding without difficulties  Pt is meeting postpartum milestones appropriately, plan for discharge home today.  Janyth Pupa, DO [phone] (pager) [phone] (office)

## 2013-07-23 NOTE — Discharge Instructions (Signed)
Vaginal Delivery Care After Refer to this sheet in the next few weeks. These discharge instructions provide you with information on caring for yourself after delivery. Your caregiver may also give you specific instructions. Your treatment has been planned according to the most current medical practices available, but problems sometimes occur. Call your caregiver if you have any problems or questions after you go home. HOME CARE INSTRUCTIONS  Take over-the-counter or prescription medicines only as directed by your caregiver or pharmacist.  You may take Motrin or Tylenol for pain.  Do not drink alcohol, especially if you are breastfeeding or taking medicine to relieve pain.  Do not chew or smoke tobacco.  Do not use illegal drugs.  Continue to use good perineal care. Good perineal care includes:  Wiping your perineum from front to back.  Keeping your perineum clean.  Do not use tampons or douche until your caregiver says it is okay.  Shower, wash your hair, and take tub baths as directed by your caregiver.  Wear a well-fitting bra that provides breast support.  Eat healthy foods.  Drink enough fluids to keep your urine clear or pale yellow.  Eat high-fiber foods such as whole grain cereals and breads, brown rice, beans, and fresh fruits and vegetables every day. These foods may help prevent or relieve constipation.  Follow your cargiver's recommendations regarding resumption of activities such as climbing stairs, driving, lifting, exercising, or traveling.  Talk to your caregiver about resuming sexual activities. Resumption of sexual activities is dependent upon your risk of infection, your rate of healing, and your comfort and desire to resume sexual activity.  Try to have someone help you with your household activities and your newborn for at least a few days after you leave the hospital.  Rest as much as possible. Try to rest or take a nap when your newborn is  sleeping.  Increase your activities gradually.  Keep all of your scheduled postpartum appointments. It is very important to keep your scheduled follow-up appointments. At these appointments, your caregiver will be checking to make sure that you are healing physically and emotionally. SEEK MEDICAL CARE IF:   You are passing large clots from your vagina. Save any clots to show your caregiver.  You have a foul smelling discharge from your vagina.  You have trouble urinating.  You are urinating frequently.  You have pain when you urinate.  You have a change in your bowel movements.  You have increasing redness, pain, or swelling near your vaginal incision (episiotomy) or vaginal tear.  You have pus draining from your episiotomy or vaginal tear.  Your episiotomy or vaginal tear is separating.  You have painful, hard, or reddened breasts.  You have a severe headache.  You have blurred vision or see spots.  You feel sad or depressed.  You have thoughts of hurting yourself or your newborn.  You have questions about your care, the care of your newborn, or medicines.  You are dizzy or lightheaded.  You have a rash.  You have nausea or vomiting.  You were breastfeeding and have not had a menstrual period within 12 weeks after you stopped breastfeeding.  You are not breastfeeding and have not had a menstrual period by the 12th week after delivery.  You have a fever. SEEK IMMEDIATE MEDICAL CARE IF:   You have persistent pain.  You have chest pain.  You have shortness of breath.  You faint.  You have leg pain.  You have stomach pain.  Your vaginal bleeding saturates two or more sanitary pads in 1 hour. MAKE SURE YOU:   Understand these instructions.  Will watch your condition.  Will get help right away if you are not doing well or get worse. Document Released: 01/14/2000 Document Revised: 10/11/2011 Document Reviewed: 09/13/2011 [facility] Patient Information  2015 [place], [place]. This information is not intended to replace advice given to you by your health care provider. Make sure you discuss any questions you have with your health care provider.

## 2013-07-23 NOTE — H&P (Signed)
Savannah Thomas is a 33 y.o. female presenting for onset of labor, ctx since about midnight, denies any medical or preg complications.   Pt began [facility] at [facility] at [place] [facility] based on 6wk [place] Routine OB care otherwise   Maternal Medical History:  Reason for admission: Contractions.   Contractions: Onset was 6-12 hours ago.   Frequency: regular.   Perceived severity is strong.    Fetal activity: Perceived fetal activity is normal.   Last perceived fetal movement was within the past hour.    Prenatal complications: no prenatal complications   OB History   Grav Para Term Preterm Abortions TAB SAB Ect Mult Living   7 5 4 1 2 2  0   5     10/06 C/S 8#5oz 40wks 1/08 SVD 5#15oz 35wks, PTL 12/09 SVD 6#7oz 40wks 7/12 SVD 7#6oz 39wks    Past Medical History  Diagnosis Date  . Abnormal Pap smear   . Preterm labor   . Fibroids   . Chlamydia   . Candidiasis   . Asthma   . Migraines   . Hypertension    Past Surgical History  Procedure Laterality Date  . Dilation and curettage, diagnostic / therapeutic      TAB  . Dilation and curettage of uterus    . Cesarean section    . No past surgeries     Family History: family history includes Cancer in her maternal grandmother; Heart disease in her cousin and paternal grandfather; Hypertension in her father, maternal grandfather, maternal grandmother, mother, paternal grandfather, and paternal grandmother. Social History:  reports that she has never smoked. She has never used smokeless tobacco. She reports that she does not drink alcohol or use illicit drugs.   Prenatal Transfer Tool  Maternal Diabetes: No Genetic Screening: Normal Maternal Ultrasounds/Referrals: Normal Fetal Ultrasounds or other Referrals:  None Maternal Substance Abuse:  No Significant Maternal Medications:  None Significant Maternal Lab Results:  None Other Comments:  None  Review of Systems  Psychiatric/Behavioral: The patient is nervous/anxious.   All  other systems reviewed and are negative.   Dilation: 10 Effacement (%): 100 Station: -3 Exam by:: S. Lillard, CNM Blood pressure 128/85, pulse 81, temperature 97.3 F (36.3 C), temperature source Oral, resp. rate 20, height 5' 5.5" (1.664 m), weight 308 lb (139.708 kg), last menstrual period 10/01/2012, unknown if currently breastfeeding. Maternal Exam:  Uterine Assessment: Contraction strength is firm.  Contraction frequency is regular.   Abdomen: Patient reports no abdominal tenderness. Fundal height is aga.   Estimated fetal weight is 7# .   Fetal presentation: vertex  Introitus: Normal vulva. Normal vagina.  Pelvis: adequate for delivery.   Cervix: Cervix evaluated by digital exam.     Fetal Exam Fetal Monitor Review: Mode: ultrasound.       Physical Exam  Nursing note and vitals reviewed. Constitutional: She is oriented to person, place, and time. She appears well-developed and well-nourished. She appears distressed.  HENT:  Head: Normocephalic.  Eyes: Pupils are equal, round, and reactive to light.  Neck: Normal range of motion.  Cardiovascular: Normal rate, regular rhythm and normal heart sounds.   Respiratory: Effort normal and breath sounds normal.  GI: Soft. Bowel sounds are normal.  Genitourinary: Vagina normal.  Musculoskeletal: Normal range of motion.  Neurological: She is alert and oriented to person, place, and time. She has normal reflexes.  Skin: Skin is warm and dry.  Psychiatric: She has a normal mood and affect. Her behavior is  normal.    Prenatal labs: ABO, Rh: --/--/A POS (06/23 2992) Antibody: NEG (06/23 0708) Rubella:   IMM RPR: NON REAC (06/23 0543)  HBsAg:   neg HIV:   neg  GBS: Negative (06/13 0000)   Assessment/Plan: IUP at 37wks TOLAC (hx C/S then successful VBAC x3)  Cervix complete BSUS to confirm vtx FHR reassuring GBS, results unavailable at time of delivery  Admit to b.s. Per c/w Dr Charlesetta Garibaldi Routine L&D orders Imminent  delivery    LILLARD,SHELLEY M 07/23/2013, 2:03 PM

## 2013-07-28 NOTE — Discharge Summary (Signed)
Obstetric Discharge Summary Reason for Admission: onset of labor Prenatal Procedures: none Intrapartum Procedures: spontaneous vaginal delivery Postpartum Procedures: none Complications-Operative and Postpartum: none Date of Admission: 07/22/13 Date of Discharge: 07/23/13  Hospital Course:  Savannah Thomas is a 32y [id number] @ [redacted]w[redacted]d who presented in active labor.  Pregnancy complicated by: -Prior C-section x1, followed by h/o successful VBACx3 -obesity, current BMI 50 The patient reported painful contractions since midnight and upon arrival to [facility] the following morning, she was already completely dilated, -3 station.  She was immediately brought to L&D, where midwife Arville Go performed the delivery.  For further information please see the delivery summary- female infant, weight 7#10oz, APGARs: 9,9.  The patient's postpartum course was uncomplicated and she was discharged home in stable condition on PPD#1.  Physical Exam:  General: alert, cooperative and no distress Lochia: appropriate Uterine Fundus: firm, non-tender, below umbilicus DVT Evaluation: No evidence of DVT seen on physical exam.  LABS:  Hemoglobin  Date Value Ref Range Status  07/23/2013 9.8* 12.0 - 15.0 g/dL Final     HCT  Date Value Ref Range Status  07/23/2013 31.1* 36.0 - 46.0 % Final    Discharge Diagnoses: Term Pregnancy-delivered  Discharge Information: Date: 07/28/2013 Activity: pelvic rest Diet: routine Medications: PNV and Ibuprofen Condition: stable Instructions: refer to practice specific booklet Discharge to: home Follow-up Information   Follow up with Janyth Pupa, M, DO In 6 weeks.   Specialty:  Obstetrics and Gynecology   Contact information:   [address] [place] [address] [phone]       Newborn Data: Live born female  Birth Weight: 7 lb 10 oz (3459 g) APGAR: 9, 9  Home with mother.  Janyth Pupa, M 07/28/2013, 7:50 AM

## 2013-12-01 ENCOUNTER — Encounter (HOSPITAL_COMMUNITY): Payer: Self-pay | Admitting: General Practice

## 2014-01-24 ENCOUNTER — Emergency Department (HOSPITAL_COMMUNITY)
Admission: EM | Admit: 2014-01-24 | Discharge: 2014-01-24 | Disposition: A | Discharge status: Home / Self Care | Payer: PRIVATE HEALTH INSURANCE | Attending: Emergency Medicine | Admitting: Emergency Medicine

## 2014-01-24 ENCOUNTER — Encounter (HOSPITAL_COMMUNITY): Payer: Self-pay | Admitting: Emergency Medicine

## 2014-01-24 ENCOUNTER — Emergency Department (HOSPITAL_COMMUNITY): Payer: PRIVATE HEALTH INSURANCE

## 2014-01-24 DIAGNOSIS — Z8619 Personal history of other infectious and parasitic diseases: Secondary | ICD-10-CM | POA: Diagnosis not present

## 2014-01-24 DIAGNOSIS — R1033 Periumbilical pain: Secondary | ICD-10-CM | POA: Diagnosis not present

## 2014-01-24 DIAGNOSIS — R109 Unspecified abdominal pain: Secondary | ICD-10-CM | POA: Diagnosis present

## 2014-01-24 DIAGNOSIS — J45909 Unspecified asthma, uncomplicated: Secondary | ICD-10-CM | POA: Diagnosis not present

## 2014-01-24 DIAGNOSIS — Z79899 Other long term (current) drug therapy: Secondary | ICD-10-CM | POA: Diagnosis not present

## 2014-01-24 DIAGNOSIS — I10 Essential (primary) hypertension: Secondary | ICD-10-CM | POA: Insufficient documentation

## 2014-01-24 DIAGNOSIS — Z3202 Encounter for pregnancy test, result negative: Secondary | ICD-10-CM | POA: Diagnosis not present

## 2014-01-24 LAB — COMPREHENSIVE METABOLIC PANEL WITH GFR
ALT: 18 U/L (ref 0–35)
AST: 30 U/L (ref 0–37)
Albumin: 3.4 g/dL — ABNORMAL LOW (ref 3.5–5.2)
Alkaline Phosphatase: 65 U/L (ref 39–117)
Anion gap: 10 (ref 5–15)
BUN: 11 mg/dL (ref 6–23)
CO2: 23 mmol/L (ref 19–32)
Calcium: 9.2 mg/dL (ref 8.4–10.5)
Chloride: 104 meq/L (ref 96–112)
Creatinine, Ser: 0.77 mg/dL (ref 0.50–1.10)
GFR calc Af Amer: >90 mL/min (ref >90)
GFR calc non Af Amer: >90 mL/min (ref >90)
Glucose, Bld: 103 mg/dL — ABNORMAL HIGH (ref 70–99)
Potassium: 3.6 mmol/L (ref 3.5–5.1)
Sodium: 137 mmol/L (ref 135–145)
Total Bilirubin: 0.2 mg/dL — ABNORMAL LOW (ref 0.3–1.2)
Total Protein: 6.8 g/dL (ref 6.0–8.3)

## 2014-01-24 LAB — CBC WITH DIFFERENTIAL/PLATELET
BASOS PCT: 0 % (ref 0–1)
Basophils Absolute: 0 10*3/uL (ref 0.0–0.1)
Eosinophils Absolute: 0.1 10*3/uL (ref 0.0–0.7)
Eosinophils Relative: 1 % (ref 0–5)
HCT: 35.3 % — ABNORMAL LOW (ref 36.0–46.0)
Hemoglobin: 11.5 g/dL — ABNORMAL LOW (ref 12.0–15.0)
Lymphocytes Relative: 47 % — ABNORMAL HIGH (ref 12–46)
Lymphs Abs: 4.3 10*3/uL — ABNORMAL HIGH (ref 0.7–4.0)
MCH: 26.9 pg (ref 26.0–34.0)
MCHC: 32.6 g/dL (ref 30.0–36.0)
MCV: 82.7 fL (ref 78.0–100.0)
MONO ABS: 0.4 10*3/uL (ref 0.1–1.0)
MONOS PCT: 4 % (ref 3–12)
NEUTROS PCT: 48 % (ref 43–77)
Neutro Abs: 4.4 10*3/uL (ref 1.7–7.7)
Platelets: 264 10*3/uL (ref 150–400)
RBC: 4.27 MIL/uL (ref 3.87–5.11)
RDW: 13.7 % (ref 11.5–15.5)
WBC: 9.1 10*3/uL (ref 4.0–10.5)

## 2014-01-24 LAB — URINALYSIS, ROUTINE W REFLEX MICROSCOPIC
Bilirubin Urine: NEGATIVE
Glucose, UA: NEGATIVE mg/dL
Hgb urine dipstick: NEGATIVE
Ketones, ur: NEGATIVE mg/dL
Leukocytes, UA: NEGATIVE
NITRITE: NEGATIVE
Protein, ur: NEGATIVE mg/dL
SPECIFIC GRAVITY, URINE: 1.021 (ref 1.005–1.030)
UROBILINOGEN UA: 0.2 mg/dL (ref 0.0–1.0)
pH: 7 (ref 5.0–8.0)

## 2014-01-24 LAB — LIPASE, BLOOD: LIPASE: 34 U/L (ref 11–59)

## 2014-01-24 LAB — POC URINE PREG, ED: Preg Test, Ur: NEGATIVE

## 2014-01-24 MED ORDER — IOHEXOL 300 MG/ML  SOLN
25.0000 mL | Freq: Once | INTRAMUSCULAR | Status: AC | PRN
  Administered 2014-01-24: 25 mL via ORAL

## 2014-01-24 MED ORDER — MORPHINE SULFATE 4 MG/ML IJ SOLN
4.0000 mg | Freq: Once | INTRAMUSCULAR | Status: AC
  Administered 2014-01-24: 4 mg via INTRAVENOUS
  Filled 2014-01-24: qty 1

## 2014-01-24 MED ORDER — TRAMADOL HCL 50 MG PO TABS: 50.0000 mg | ORAL_TABLET | Freq: Four times a day (QID) | ORAL | Status: DC | PRN

## 2014-01-24 MED ORDER — IOHEXOL 300 MG/ML  SOLN
100.0000 mL | Freq: Once | INTRAMUSCULAR | Status: AC | PRN
  Administered 2014-01-24: 100 mL via INTRAVENOUS

## 2014-01-24 NOTE — ED Notes (Signed)
Patient here with abdominal pain originating near the umbilicus and radiating towards RUQ. Endorses tenderness. Denies fevers, nausea, vomiting, diarrhea and history of the same.

## 2014-01-24 NOTE — ED Notes (Signed)
MD at bedside. 

## 2014-01-24 NOTE — ED Notes (Signed)
CT called and informed pt has finished her contrast.  

## 2014-01-24 NOTE — ED Notes (Signed)
PT ambulated with baseline gait; VSS; A&Ox3; no signs of distress; respirations even and unlabored; skin warm and dry; no questions upon discharge.  

## 2014-01-24 NOTE — ED Notes (Signed)
Pt c/o sharp pain at umbilicus with tenderness. States pain began this morning around lunch time. Denies n/v/d or any urinary complaints. Pt alert, oriented, NAD.

## 2014-01-24 NOTE — ED Notes (Signed)
Patient is resting comfortably. 

## 2014-01-24 NOTE — Discharge Instructions (Signed)
Abdominal Pain Many things can cause abdominal pain. Usually, abdominal pain is not caused by a disease and will improve without treatment. It can often be observed and treated at home. Your health care provider will do a physical exam and possibly order blood tests and X-rays to help determine the seriousness of your pain. However, in many cases, more time must pass before a clear cause of the pain can be found. Before that point, your health care provider may not know if you need more testing or further treatment. HOME CARE INSTRUCTIONS  Monitor your abdominal pain for any changes. The following actions may help to alleviate any discomfort you are experiencing:  Only take over-the-counter or prescription medicines as directed by your health care provider.  Do not take laxatives unless directed to do so by your health care provider.  Try a clear liquid diet (broth, tea, or water) as directed by your health care provider. Slowly move to a bland diet as tolerated. SEEK MEDICAL CARE IF:  You have unexplained abdominal pain.  You have abdominal pain associated with nausea or diarrhea.  You have pain when you urinate or have a bowel movement.  You experience abdominal pain that wakes you in the night.  You have abdominal pain that is worsened or improved by eating food.  You have abdominal pain that is worsened with eating fatty foods.  You have a fever. SEEK IMMEDIATE MEDICAL CARE IF:   Your pain does not go away within 2 hours.  You keep throwing up (vomiting).  Your pain is felt only in portions of the abdomen, such as the right side or the left lower portion of the abdomen.  You pass bloody or black tarry stools. MAKE SURE YOU:  Understand these instructions.   Will watch your condition.   Will get help right away if you are not doing well or get worse.  Document Released: 10/26/2004 Document Revised: 01/21/2013 Document Reviewed: 09/25/2012 [facility] Patient Information  2015 [place], [place]. This information is not intended to replace advice given to you by your health care provider. Make sure you discuss any questions you have with your health care provider.  Tramadol tablets What is this medicine? TRAMADOL (TRA ma dole) is a pain reliever. It is used to treat moderate to severe pain in adults. This medicine may be used for other purposes; ask your health care provider or pharmacist if you have questions. COMMON BRAND NAME(S): Ultram What should I tell my health care provider before I take this medicine? They need to know if you have any of these conditions: -brain tumor -depression -drug abuse or addiction -head injury -if you frequently drink alcohol containing drinks -kidney disease or trouble passing urine -liver disease -lung disease, asthma, or breathing problems -seizures or epilepsy -suicidal thoughts, plans, or attempt; a previous suicide attempt by you or a family member -an unusual or allergic reaction to tramadol, codeine, other medicines, foods, dyes, or preservatives -pregnant or trying to get pregnant -breast-feeding How should I use this medicine? Take this medicine by mouth with a full glass of water. Follow the directions on the prescription label. If the medicine upsets your stomach, take it with food or milk. Do not take more medicine than you are told to take. Talk to your pediatrician regarding the use of this medicine in children. Special care may be needed. Overdosage: If you think you have taken too much of this medicine contact a poison control center or emergency room at once. NOTE: This  medicine is only for you. Do not share this medicine with others. What if I miss a dose? If you miss a dose, take it as soon as you can. If it is almost time for your next dose, take only that dose. Do not take double or extra doses. What may interact with this medicine? Do not take this medicine with any of the following medications: -MAOIs  like Carbex, Eldepryl, Marplan, Nardil, and Parnate This medicine may also interact with the following medications: -alcohol or medicines that contain alcohol -antihistamines -benzodiazepines -bupropion -carbamazepine or oxcarbazepine -clozapine -cyclobenzaprine -digoxin -furazolidone -linezolid -medicines for depression, anxiety, or psychotic disturbances -medicines for migraine headache like almotriptan, eletriptan, frovatriptan, naratriptan, rizatriptan, sumatriptan, zolmitriptan -medicines for pain like pentazocine, buprenorphine, butorphanol, meperidine, nalbuphine, and propoxyphene -medicines for sleep -muscle relaxants -naltrexone -phenobarbital -phenothiazines like perphenazine, thioridazine, chlorpromazine, mesoridazine, fluphenazine, prochlorperazine, promazine, and trifluoperazine -procarbazine -warfarin This list may not describe all possible interactions. Give your health care provider a list of all the medicines, herbs, non-prescription drugs, or dietary supplements you use. Also tell them if you smoke, drink alcohol, or use illegal drugs. Some items may interact with your medicine. What should I watch for while using this medicine? Tell your doctor or health care professional if your pain does not go away, if it gets worse, or if you have new or a different type of pain. You may develop tolerance to the medicine. Tolerance means that you will need a higher dose of the medicine for pain relief. Tolerance is normal and is expected if you take this medicine for a long time. Do not suddenly stop taking your medicine because you may develop a severe reaction. Your body becomes used to the medicine. This does NOT mean you are addicted. Addiction is a behavior related to getting and using a drug for a non-medical reason. If you have pain, you have a medical reason to take pain medicine. Your doctor will tell you how much medicine to take. If your doctor wants you to stop the  medicine, the dose will be slowly lowered over time to avoid any side effects. You may get drowsy or dizzy. Do not drive, use machinery, or do anything that needs mental alertness until you know how this medicine affects you. Do not stand or sit up quickly, especially if you are an older patient. This reduces the risk of dizzy or fainting spells. Alcohol can increase or decrease the effects of this medicine. Avoid alcoholic drinks. You may have constipation. Try to have a bowel movement at least every 2 to 3 days. If you do not have a bowel movement for 3 days, call your doctor or health care professional. Your mouth may get dry. Chewing sugarless gum or sucking hard candy, and drinking plenty of water may help. Contact your doctor if the problem does not go away or is severe. What side effects may I notice from receiving this medicine? Side effects that you should report to your doctor or health care professional as soon as possible: -allergic reactions like skin rash, itching or hives, swelling of the face, lips, or tongue -breathing difficulties, wheezing -confusion -itching -light headedness or fainting spells -redness, blistering, peeling or loosening of the skin, including inside the mouth -seizures Side effects that usually do not require medical attention (report to your doctor or health care professional if they continue or are bothersome): -constipation -dizziness -drowsiness -headache -nausea, vomiting This list may not describe all possible side effects. Call your doctor for medical advice  about side effects. You may report side effects to FDA at 1-800-FDA-1088. Where should I keep my medicine? Keep out of the reach of children. Store at room temperature between 15 and 30 degrees C (59 and 86 degrees F). Keep container tightly closed. Throw away any unused medicine after the expiration date. NOTE: This sheet is a summary. It may not cover all possible information. If you have  questions about this medicine, talk to your doctor, pharmacist, or health care provider.  2015, [employer]. (2009-09-29 11:55:44)

## 2014-01-24 NOTE — ED Provider Notes (Signed)
CSN: [id number]     Arrival date & time 01/24/14  0054 History   First MD Initiated Contact with Patient 01/24/14 0445     Chief Complaint  Patient presents with  . Abdominal Pain     (Consider location/radiation/quality/duration/timing/severity/associated sxs/prior Treatment) Patient is a 33 y.o. female presenting with abdominal pain. The history is provided by the patient.  Abdominal Pain She complains of periumbilical pain which has been present throughout the day. Pain is well localized without radiation. She describes it as a dull pain and she rates it at 7/10. There is no associated nausea, vomiting, diarrhea. There is no constipation. She denies urinary urgency, frequency, tenesmus, dysuria. She took ibuprofen which did give her partial relief and pain is down to 6/10. She has not had pain like this before.  Past Medical History  Diagnosis Date  . Abnormal Pap smear   . Preterm labor   . Fibroids   . Chlamydia   . Candidiasis   . Asthma   . Migraines   . Hypertension    Past Surgical History  Procedure Laterality Date  . Dilation and curettage, diagnostic / therapeutic      TAB  . Dilation and curettage of uterus    . Cesarean section    . No past surgeries     Family History  Problem Relation Age of Onset  . Hypertension Mother   . Hypertension Father   . Cancer Maternal Grandmother   . Hypertension Maternal Grandmother   . Hypertension Maternal Grandfather   . Hypertension Paternal Grandmother   . Heart disease Paternal Grandfather   . Hypertension Paternal Grandfather   . Heart disease Cousin    History  Substance Use Topics  . Smoking status: Never Smoker   . Smokeless tobacco: Never Used  . Alcohol Use: No   OB History    Gravida Para Term Preterm AB TAB SAB Ectopic Multiple Living   7 5 4 1 2 2  0   5     Review of Systems  Gastrointestinal: Positive for abdominal pain.  All other systems reviewed and are negative.     Allergies  Review of  patient's allergies indicates no known allergies.  Home Medications   Prior to Admission medications   Medication Sig Start Date End Date Taking? Authorizing Provider  levonorgestrel (MIRENA) 20 MCG/24HR IUD 1 each by Intrauterine route once.   Yes Historical Provider, MD  ibuprofen (ADVIL,MOTRIN) 600 MG tablet Take 1 tablet (600 mg total) by mouth every 6 (six) hours. Patient not taking: Reported on 01/24/2014 07/23/13   Clearnce Sorrel Ozan, DO  pantoprazole (PROTONIX) 20 MG tablet Take 1 tablet (20 mg total) by mouth daily. Patient not taking: Reported on 01/24/2014 07/16/13   Seabron Spates, CNM  Prenatal Vit-Fe Fumarate-FA (PRENATAL MULTIVITAMIN) TABS tablet Take 1 tablet by mouth daily at 12 noon.    Historical Provider, MD   BP 149/87 mmHg  Pulse 83  Temp(Src) 98.4 F (36.9 C) (Oral)  Resp 15  Wt 307 lb 14.4 oz (139.663 kg)  SpO2 96% Physical Exam  Nursing note and vitals reviewed.  33 year old female, resting comfortably and in no acute distress. Vital signs are significant for hypertension. Oxygen saturation is 96%, which is normal. Head is normocephalic and atraumatic. PERRLA, EOMI. Oropharynx is clear. Neck is nontender and supple without adenopathy or JVD. Back is nontender and there is no CVA tenderness. Lungs are clear without rales, wheezes, or rhonchi. Chest is nontender. Heart  has regular rate and rhythm without murmur. Abdomen is soft, flat, with tenderness for a well localized to the periumbilical area. No definite hernia is palpable. There is no erythema or warmth. There are no masses or hepatosplenomegaly and peristalsis is normoactive. Extremities have no cyanosis or edema, full range of motion is present. Skin is warm and dry without rash. Neurologic: Mental status is normal, cranial nerves are intact, there are no motor or sensory deficits.  ED Course  Procedures (including critical care time) Labs Review Results for orders placed or performed during the  hospital encounter of 01/24/14  CBC with Differential  Result Value Ref Range   WBC 9.1 4.0 - 10.5 K/uL   RBC 4.27 3.87 - 5.11 MIL/uL   Hemoglobin 11.5 (L) 12.0 - 15.0 g/dL   HCT 35.3 (L) 36.0 - 46.0 %   MCV 82.7 78.0 - 100.0 fL   MCH 26.9 26.0 - 34.0 pg   MCHC 32.6 30.0 - 36.0 g/dL   RDW 13.7 11.5 - 15.5 %   Platelets 264 150 - 400 K/uL   Neutrophils Relative % 48 43 - 77 %   Neutro Abs 4.4 1.7 - 7.7 K/uL   Lymphocytes Relative 47 (H) 12 - 46 %   Lymphs Abs 4.3 (H) 0.7 - 4.0 K/uL   Monocytes Relative 4 3 - 12 %   Monocytes Absolute 0.4 0.1 - 1.0 K/uL   Eosinophils Relative 1 0 - 5 %   Eosinophils Absolute 0.1 0.0 - 0.7 K/uL   Basophils Relative 0 0 - 1 %   Basophils Absolute 0.0 0.0 - 0.1 K/uL  Comprehensive metabolic panel  Result Value Ref Range   Sodium 137 135 - 145 mmol/L   Potassium 3.6 3.5 - 5.1 mmol/L   Chloride 104 96 - 112 mEq/L   CO2 23 19 - 32 mmol/L   Glucose, Bld 103 (H) 70 - 99 mg/dL   BUN 11 6 - 23 mg/dL   Creatinine, Ser 0.77 0.50 - 1.10 mg/dL   Calcium 9.2 8.4 - 10.5 mg/dL   Total Protein 6.8 6.0 - 8.3 g/dL   Albumin 3.4 (L) 3.5 - 5.2 g/dL   AST 30 0 - 37 U/L   ALT 18 0 - 35 U/L   Alkaline Phosphatase 65 39 - 117 U/L   Total Bilirubin 0.2 (L) 0.3 - 1.2 mg/dL   GFR calc non Af Amer >90 >90 mL/min   GFR calc Af Amer >90 >90 mL/min   Anion gap 10 5 - 15  Lipase, blood  Result Value Ref Range   Lipase 34 11 - 59 U/L  Urinalysis, Routine w reflex microscopic  Result Value Ref Range   Color, Urine YELLOW YELLOW   APPearance CLEAR CLEAR   Specific Gravity, Urine 1.021 1.005 - 1.030   pH 7.0 5.0 - 8.0   Glucose, UA NEGATIVE NEGATIVE mg/dL   Hgb urine dipstick NEGATIVE NEGATIVE   Bilirubin Urine NEGATIVE NEGATIVE   Ketones, ur NEGATIVE NEGATIVE mg/dL   Protein, ur NEGATIVE NEGATIVE mg/dL   Urobilinogen, UA 0.2 0.0 - 1.0 mg/dL   Nitrite NEGATIVE NEGATIVE   Leukocytes, UA NEGATIVE NEGATIVE  POC Urine Pregnancy, ED  (If Pre-menopausal female) - do not  order at [facility]  Result Value Ref Range   Preg Test, Ur NEGATIVE NEGATIVE   Imaging Review Ct Abdomen Pelvis W Contrast  01/24/2014   CLINICAL DATA:  Acute onset of periumbilical abdominal pain and tenderness. Initial encounter.  EXAM: CT ABDOMEN AND  PELVIS WITH CONTRAST  TECHNIQUE: Multidetector CT imaging of the abdomen and pelvis was performed using the standard protocol following bolus administration of intravenous contrast.  CONTRAST:  153mL OMNIPAQUE IOHEXOL 300 MG/ML  SOLN  COMPARISON:  CT of the abdomen and pelvis from 06/09/2005, and pelvic ultrasound performed 07/16/2013  FINDINGS: The visualized lung bases are clear.  The liver and spleen are unremarkable in appearance. The gallbladder is within normal limits. The pancreas and adrenal glands are unremarkable.  The kidneys are unremarkable in appearance. There is no evidence of hydronephrosis. No renal or ureteral stones are seen. No perinephric stranding is appreciated.  No free fluid is identified. The small bowel is unremarkable in appearance. The stomach is within normal limits. No acute vascular abnormalities are seen.  The appendix is normal in caliber, without evidence for appendicitis. The colon is unremarkable.  The bladder is mildly distended and grossly unremarkable. The uterus is unremarkable in appearance. The intrauterine device is noted in expected position, at the fundus of the uterus. The ovaries are relatively symmetric. No suspicious adnexal masses are seen. No inguinal lymphadenopathy is seen.  No acute osseous abnormalities are identified.  IMPRESSION: Unremarkable contrast-enhanced CT of the abdomen and pelvis.   Electronically Signed   By: Garald Balding M.D.   On: 01/24/2014 06:32   Images viewed by me.   MDM   Final diagnoses:  Periumbilical pain    Well localized umbilical pain. I suspect that this is from his small hernia. She will be sent for CT scan for evaluation.  CT scan is read by radiologist as  unremarkable. On review of the scan, I question whether there might be a small umbilical hernia which might account for her pain. In any case, no evidence of serious pathology. She is discharged with prescription for tramadol and is to return should symptoms worsen.  Delora Fuel, MD 07/25/92 [id number]

## 2014-06-25 ENCOUNTER — Emergency Department (HOSPITAL_COMMUNITY)
Admission: EM | Admit: 2014-06-25 | Discharge: 2014-06-26 | Disposition: A | Discharge status: Home / Self Care | Payer: PRIVATE HEALTH INSURANCE | Attending: Emergency Medicine | Admitting: Emergency Medicine

## 2014-06-25 DIAGNOSIS — Z79899 Other long term (current) drug therapy: Secondary | ICD-10-CM | POA: Insufficient documentation

## 2014-06-25 DIAGNOSIS — Z8619 Personal history of other infectious and parasitic diseases: Secondary | ICD-10-CM | POA: Insufficient documentation

## 2014-06-25 DIAGNOSIS — Y990 Civilian activity done for income or pay: Secondary | ICD-10-CM | POA: Insufficient documentation

## 2014-06-25 DIAGNOSIS — W228XXA Striking against or struck by other objects, initial encounter: Secondary | ICD-10-CM | POA: Diagnosis not present

## 2014-06-25 DIAGNOSIS — Y9389 Activity, other specified: Secondary | ICD-10-CM | POA: Diagnosis not present

## 2014-06-25 DIAGNOSIS — I10 Essential (primary) hypertension: Secondary | ICD-10-CM | POA: Diagnosis not present

## 2014-06-25 DIAGNOSIS — Z86018 Personal history of other benign neoplasm: Secondary | ICD-10-CM | POA: Diagnosis not present

## 2014-06-25 DIAGNOSIS — J45909 Unspecified asthma, uncomplicated: Secondary | ICD-10-CM | POA: Insufficient documentation

## 2014-06-25 DIAGNOSIS — S99922A Unspecified injury of left foot, initial encounter: Secondary | ICD-10-CM | POA: Diagnosis present

## 2014-06-25 DIAGNOSIS — Y9289 Other specified places as the place of occurrence of the external cause: Secondary | ICD-10-CM | POA: Insufficient documentation

## 2014-06-25 DIAGNOSIS — S9032XA Contusion of left foot, initial encounter: Secondary | ICD-10-CM | POA: Insufficient documentation

## 2014-06-26 ENCOUNTER — Emergency Department (HOSPITAL_COMMUNITY): Payer: PRIVATE HEALTH INSURANCE

## 2014-06-26 ENCOUNTER — Encounter (HOSPITAL_COMMUNITY): Payer: Self-pay | Admitting: *Deleted

## 2014-06-26 MED ORDER — MELOXICAM 7.5 MG PO TABS: 15.0000 mg | ORAL_TABLET | Freq: Every day | ORAL | Status: DC

## 2014-06-26 NOTE — ED Notes (Signed)
Pt reports she was at work yesterday, was moving a pt.  When the wheel of the bed hit the lateral side of her L foot.  Mild swelling noted.  Pt able to move her toes without difficulty.

## 2014-06-26 NOTE — Discharge Instructions (Signed)

## 2014-06-26 NOTE — ED Provider Notes (Signed)
CSN: [id number]     Arrival date & time 06/25/14  2344 History   First MD Initiated Contact with Patient 06/26/14 0014     Chief Complaint  Patient presents with  . Foot Injury    (Consider location/radiation/quality/duration/timing/severity/associated sxs/prior Treatment) Patient is a 34 y.o. female presenting with foot injury. The history is provided by the patient. No language interpreter was used.  Foot Injury Location:  Foot Time since incident:  2 days Injury: yes   Mechanism of injury comment:  Foot hit by a wheel of bed Foot location:  L foot Pain details:    Quality:  Aching and throbbing   Radiates to:  Does not radiate   Severity:  Mild   Onset quality:  Sudden   Duration:  2 days   Timing:  Constant   Progression:  Waxing and waning Chronicity:  New Prior injury to area:  No Relieved by:  NSAIDs Worsened by:  Bearing weight Ineffective treatments:  Rest Associated symptoms: swelling (Mild)   Associated symptoms: no decreased ROM, no muscle weakness, no numbness and no tingling     Past Medical History  Diagnosis Date  . Abnormal Pap smear   . Preterm labor   . Fibroids   . Chlamydia   . Candidiasis   . Asthma   . Migraines   . Hypertension    Past Surgical History  Procedure Laterality Date  . Dilation and curettage, diagnostic / therapeutic      TAB  . Dilation and curettage of uterus    . Cesarean section    . No past surgeries     Family History  Problem Relation Age of Onset  . Hypertension Mother   . Hypertension Father   . Cancer Maternal Grandmother   . Hypertension Maternal Grandmother   . Hypertension Maternal Grandfather   . Hypertension Paternal Grandmother   . Heart disease Paternal Grandfather   . Hypertension Paternal Grandfather   . Heart disease Cousin    History  Substance Use Topics  . Smoking status: Never Smoker   . Smokeless tobacco: Never Used  . Alcohol Use: No   OB History    Gravida Para Term Preterm AB TAB  SAB Ectopic Multiple Living   7 5 4 1 2 2  0   5      Review of Systems  Musculoskeletal: Positive for myalgias, joint swelling and arthralgias.  All other systems reviewed and are negative.   Allergies  Review of patient's allergies indicates no known allergies.  Home Medications   Prior to Admission medications   Medication Sig Start Date End Date Taking? Authorizing Provider  ibuprofen (ADVIL,MOTRIN) 600 MG tablet Take 1 tablet (600 mg total) by mouth every 6 (six) hours. Patient not taking: Reported on 01/24/2014 07/23/13   Janyth Pupa, DO  levonorgestrel (MIRENA) 20 MCG/24HR IUD 1 each by Intrauterine route once.    Historical Provider, MD  meloxicam (MOBIC) 7.5 MG tablet Take 2 tablets (15 mg total) by mouth daily. 06/26/14   Antonietta Breach, PA-C  pantoprazole (PROTONIX) 20 MG tablet Take 1 tablet (20 mg total) by mouth daily. Patient not taking: Reported on 01/24/2014 07/16/13   Seabron Spates, CNM  Prenatal Vit-Fe Fumarate-FA (PRENATAL MULTIVITAMIN) TABS tablet Take 1 tablet by mouth daily at 12 noon.    Historical Provider, MD  traMADol (ULTRAM) 50 MG tablet Take 1 tablet (50 mg total) by mouth every 6 (six) hours as needed. 03/54/65   Delora Fuel, MD  BP 120/89 mmHg  Pulse 88  Temp(Src) 98.3 F (36.8 C) (Oral)  Resp 16  SpO2 98%   Physical Exam  Constitutional: She is oriented to person, place, and time. She appears well-developed and well-nourished. No distress.  HENT:  Head: Normocephalic and atraumatic.  Eyes: Conjunctivae and EOM are normal. No scleral icterus.  Neck: Normal range of motion.  Cardiovascular: Normal rate, regular rhythm and intact distal pulses.   DP and PT pulses 2+ in the left lower extremity  Pulmonary/Chest: Effort normal. No respiratory distress.  Respirations even and unlabored  Musculoskeletal: Normal range of motion.       Left foot: There is tenderness, bony tenderness and swelling (mild). There is normal range of motion, normal  capillary refill, no crepitus, no deformity and no laceration.       Feet:  Neurological: She is alert and oriented to person, place, and time. She exhibits normal muscle tone. Coordination normal.  Sensation to light touch intact in the left lower extremity and foot. Patient able to wiggle all toes. Patient ambulatory with steady gait.  Skin: Skin is warm and dry. No rash noted. She is not diaphoretic. No erythema. No pallor.  Psychiatric: She has a normal mood and affect. Her behavior is normal.  Nursing note and vitals reviewed.   ED Course  Procedures (including critical care time) Labs Review Labs Reviewed - No data to display  Imaging Review Dg Foot Complete Left  06/26/2014   CLINICAL DATA:  Dorsal foot pain after injury at work. Initial encounter.  EXAM: LEFT FOOT - COMPLETE 3+ VIEW  COMPARISON:  None.  FINDINGS: There is no evidence of fracture or dislocation. Soft tissues are unremarkable.  IMPRESSION: Negative.   Electronically Signed   By: Monte Fantasia M.D.   On: 06/26/2014 01:47     EKG Interpretation None      MDM   Final diagnoses:  Contusion, foot, left, initial encounter    34 year old female presents to the emergency department for further evaluation of left foot pain after it was hit by the wheel of a stretcher bed while at work yesterday. Patient is neurovascularly intact and ambulatory. No crepitus or deformity. X-ray negative for acute fracture or malalignment. Will manage supportively with NSAIDs and icing. Patient given postop shoe prior to discharge for comfort. Primary care follow up advised and return precautions given. Patient discharged in good condition with no unaddressed concerns.   Filed Vitals:   06/26/14 0042  BP: 120/89  Pulse: 88  Temp: 98.3 F (36.8 C)  TempSrc: Oral  Resp: 16  SpO2: 98%       Antonietta Breach, PA-C 06/26/14 0239  Varney Biles, MD 06/26/14 [phone]

## 2014-07-24 ENCOUNTER — Encounter (HOSPITAL_COMMUNITY): Payer: Self-pay

## 2014-07-24 ENCOUNTER — Inpatient Hospital Stay (HOSPITAL_COMMUNITY)
Admission: AD | Admit: 2014-07-24 | Discharge: 2014-07-24 | Disposition: A | Discharge status: Home / Self Care | Payer: PRIVATE HEALTH INSURANCE | Source: Ambulatory Visit | Attending: Obstetrics & Gynecology | Admitting: Obstetrics & Gynecology

## 2014-07-24 DIAGNOSIS — N939 Abnormal uterine and vaginal bleeding, unspecified: Secondary | ICD-10-CM | POA: Diagnosis not present

## 2014-07-24 LAB — URINE MICROSCOPIC-ADD ON

## 2014-07-24 LAB — URINALYSIS, ROUTINE W REFLEX MICROSCOPIC
BILIRUBIN URINE: NEGATIVE
Glucose, UA: NEGATIVE mg/dL
Ketones, ur: NEGATIVE mg/dL
Leukocytes, UA: NEGATIVE
NITRITE: NEGATIVE
Protein, ur: 100 mg/dL — AB
Specific Gravity, Urine: >1.03 — ABNORMAL HIGH (ref 1.005–1.030)
UROBILINOGEN UA: 0.2 mg/dL (ref 0.0–1.0)
pH: 6.5 (ref 5.0–8.0)

## 2014-07-24 LAB — CBC
HCT: 37.3 % (ref 36.0–46.0)
Hemoglobin: 12.3 g/dL (ref 12.0–15.0)
MCH: 26.3 pg (ref 26.0–34.0)
MCHC: 33 g/dL (ref 30.0–36.0)
MCV: 79.7 fL (ref 78.0–100.0)
PLATELETS: 288 10*3/uL (ref 150–400)
RBC: 4.68 MIL/uL (ref 3.87–5.11)
RDW: 14.2 % (ref 11.5–15.5)
WBC: 10.5 10*3/uL (ref 4.0–10.5)

## 2014-07-24 LAB — POCT PREGNANCY, URINE: PREG TEST UR: NEGATIVE

## 2014-07-24 MED ORDER — MEDROXYPROGESTERONE ACETATE 10 MG PO TABS: 10.0000 mg | ORAL_TABLET | Freq: Every day | ORAL | Status: DC

## 2014-07-24 NOTE — MAU Provider Note (Signed)
History     CSN: [id number]  Arrival date and time: 07/24/14 1153   First Provider Initiated Contact with Patient 07/24/14 1222      Chief Complaint  Patient presents with  . Vaginal Bleeding  . Abdominal Pain   HPI  Ms. Savannah Thomas is a [id number] here with report of vaginal bleeding x 3 days.  Bleeding is described as heavy.  Changing pad every 1.5 hours.  +clots with passage of IUD last night.  +fibroids in the past.  Mild cramping with the bleeding.    Past Medical History  Diagnosis Date  . Abnormal Pap smear   . Preterm labor   . Fibroids   . Chlamydia   . Candidiasis   . Asthma   . Migraines   . Hypertension     Past Surgical History  Procedure Laterality Date  . Dilation and curettage, diagnostic / therapeutic      TAB  . Dilation and curettage of uterus    . Cesarean section    . No past surgeries      Family History  Problem Relation Age of Onset  . Hypertension Mother   . Hypertension Father   . Cancer Maternal Grandmother   . Hypertension Maternal Grandmother   . Hypertension Maternal Grandfather   . Hypertension Paternal Grandmother   . Heart disease Paternal Grandfather   . Hypertension Paternal Grandfather   . Heart disease Cousin     History  Substance Use Topics  . Smoking status: Never Smoker   . Smokeless tobacco: Never Used  . Alcohol Use: No    Allergies: No Known Allergies  Prescriptions prior to admission  Medication Sig Dispense Refill Last Dose  . Multiple Vitamin (MULTIVITAMIN WITH MINERALS) TABS tablet Take 1 tablet by mouth daily.   07/23/2014 at Unknown time  . naproxen sodium (ALEVE) 220 MG tablet Take 440 mg by mouth 2 (two) times daily with a meal.   07/24/2014 at Unknown time  . meloxicam (MOBIC) 7.5 MG tablet Take 2 tablets (15 mg total) by mouth daily. (Patient not taking: Reported on 07/24/2014) 30 tablet 0 Completed Course at Unknown time  . [DISCONTINUED] ibuprofen (ADVIL,MOTRIN) 600 MG tablet Take 1 tablet (600 mg  total) by mouth every 6 (six) hours. (Patient not taking: Reported on 01/24/2014) 30 tablet 0   . [DISCONTINUED] pantoprazole (PROTONIX) 20 MG tablet Take 1 tablet (20 mg total) by mouth daily. (Patient not taking: Reported on 01/24/2014) 30 tablet 0 Past Week at Unknown time  . [DISCONTINUED] traMADol (ULTRAM) 50 MG tablet Take 1 tablet (50 mg total) by mouth every 6 (six) hours as needed. 15 tablet 0     Review of Systems  Gastrointestinal: Positive for abdominal pain (cramping). Negative for nausea and vomiting.  Genitourinary: Negative for dysuria and urgency.  Musculoskeletal: Negative for back pain.  Neurological: Negative for dizziness.  All other systems reviewed and are negative.  Physical Exam   Blood pressure 129/86, pulse 97, temperature 98.7 F (37.1 C), resp. rate 18, height 5\' 7"  (1.702 m), weight 138.801 kg (306 lb), unknown if currently breastfeeding.  Physical Exam  Constitutional: She is oriented to person, place, and time. She appears well-developed and well-nourished. No distress.  HENT:  Head: Normocephalic.  Neck: Normal range of motion. Neck supple.  Cardiovascular: Normal rate, regular rhythm and normal heart sounds.   Respiratory: Effort normal and breath sounds normal.  GI: Soft. There is no tenderness.  Genitourinary: There is bleeding (mod bleeding,  small clots) in the vagina.  Difficult to assess uterus secondary to weight  Neurological: She is alert and oriented to person, place, and time.  Skin: Skin is warm and dry.    MAU Course  Procedures Results for orders placed or performed during the hospital encounter of 07/24/14 (from the past 24 hour(s))  Urinalysis, Routine w reflex microscopic (not at [facility])     Status: Abnormal   Collection Time: 07/24/14 12:00 PM  Result Value Ref Range   Color, Urine RED (A) YELLOW   APPearance CLEAR CLEAR   Specific Gravity, Urine >1.030 (H) 1.005 - 1.030   pH 6.5 5.0 - 8.0   Glucose, UA NEGATIVE NEGATIVE mg/dL    Hgb urine dipstick LARGE (A) NEGATIVE   Bilirubin Urine NEGATIVE NEGATIVE   Ketones, ur NEGATIVE NEGATIVE mg/dL   Protein, ur 100 (A) NEGATIVE mg/dL   Urobilinogen, UA 0.2 0.0 - 1.0 mg/dL   Nitrite NEGATIVE NEGATIVE   Leukocytes, UA NEGATIVE NEGATIVE  Urine microscopic-add on     Status: None   Collection Time: 07/24/14 12:00 PM  Result Value Ref Range   RBC / HPF TOO NUMEROUS TO COUNT <3 RBC/hpf   Urine-Other FIELD OBSCURED BY RBC'S   Pregnancy, urine POC     Status: None   Collection Time: 07/24/14 12:06 PM  Result Value Ref Range   Preg Test, Ur NEGATIVE NEGATIVE  CBC     Status: None   Collection Time: 07/24/14 12:40 PM  Result Value Ref Range   WBC 10.5 4.0 - 10.5 K/uL   RBC 4.68 3.87 - 5.11 MIL/uL   Hemoglobin 12.3 12.0 - 15.0 g/dL   HCT 37.3 36.0 - 46.0 %   MCV 79.7 78.0 - 100.0 fL   MCH 26.3 26.0 - 34.0 pg   MCHC 33.0 30.0 - 36.0 g/dL   RDW 14.2 11.5 - 15.5 %   Platelets 288 150 - 400 K/uL   1300 Consulted with Dr. Nelda Marseille > Reviewed HPI/Exam/labs > discharge on Provera with follow-up in office to discuss family planning options  Assessment and Plan  Abnormal Uterine Bleeding  Plan: Discharge to home RX Provera 10 mg daily Schedule appointment for office Report if bleeding does not improve or worsens  Gwen Pounds, CNM

## 2014-07-24 NOTE — Discharge Instructions (Signed)
Abnormal Uterine Bleeding Abnormal uterine bleeding can affect women at various stages in life, including teenagers, women in their reproductive years, pregnant women, and women who have reached menopause. Several kinds of uterine bleeding are considered abnormal, including:  Bleeding or spotting between periods.   Bleeding after sexual intercourse.   Bleeding that is heavier or more than normal.   Periods that last longer than usual.  Bleeding after menopause.  Many cases of abnormal uterine bleeding are minor and simple to treat, while others are more serious. Any type of abnormal bleeding should be evaluated by your health care provider. Treatment will depend on the cause of the bleeding. HOME CARE INSTRUCTIONS Monitor your condition for any changes. The following actions may help to alleviate any discomfort you are experiencing:  Avoid the use of tampons and douches as directed by your health care provider.  Change your pads frequently. You should get regular pelvic exams and Pap tests. Keep all follow-up appointments for diagnostic tests as directed by your health care provider.  SEEK MEDICAL CARE IF:   Your bleeding lasts more than 1 week.   You feel dizzy at times.  SEEK IMMEDIATE MEDICAL CARE IF:   You pass out.   You are changing pads every 15 to 30 minutes.   You have abdominal pain.  You have a fever.   You become sweaty or weak.   You are passing large blood clots from the vagina.   You start to feel nauseous and vomit. MAKE SURE YOU:   Understand these instructions.  Will watch your condition.  Will get help right away if you are not doing well or get worse. Document Released: 01/16/2005 Document Revised: 01/21/2013 Document Reviewed: 08/15/2012 Exit[employer] Patient Information 2015 [employer]. This information is not intended to replace advice given to you by your health care provider. Make sure you discuss any questions you have with your  health care provider.  

## 2014-07-24 NOTE — MAU Note (Signed)
Onset of passing big blood clots two days ago and one last night, and IUD was in blood clot (had since last fall), lower abdominal pain 5/10.

## 2014-09-05 ENCOUNTER — Inpatient Hospital Stay (HOSPITAL_COMMUNITY)
Admission: AD | Admit: 2014-09-05 | Discharge: 2014-09-06 | Disposition: A | Discharge status: Home / Self Care | Payer: PRIVATE HEALTH INSURANCE | Source: Ambulatory Visit | Attending: Obstetrics and Gynecology | Admitting: Obstetrics and Gynecology

## 2014-09-05 DIAGNOSIS — O3680X0 Pregnancy with inconclusive fetal viability, not applicable or unspecified: Secondary | ICD-10-CM

## 2014-09-05 DIAGNOSIS — O9989 Other specified diseases and conditions complicating pregnancy, childbirth and the puerperium: Secondary | ICD-10-CM | POA: Insufficient documentation

## 2014-09-05 DIAGNOSIS — R109 Unspecified abdominal pain: Secondary | ICD-10-CM | POA: Diagnosis not present

## 2014-09-05 DIAGNOSIS — Z3A01 Less than 8 weeks gestation of pregnancy: Secondary | ICD-10-CM | POA: Insufficient documentation

## 2014-09-05 DIAGNOSIS — O26899 Other specified pregnancy related conditions, unspecified trimester: Secondary | ICD-10-CM

## 2014-09-06 ENCOUNTER — Inpatient Hospital Stay (HOSPITAL_COMMUNITY): Payer: PRIVATE HEALTH INSURANCE

## 2014-09-06 ENCOUNTER — Encounter (HOSPITAL_COMMUNITY): Payer: Self-pay | Admitting: *Deleted

## 2014-09-06 DIAGNOSIS — O9989 Other specified diseases and conditions complicating pregnancy, childbirth and the puerperium: Secondary | ICD-10-CM | POA: Diagnosis not present

## 2014-09-06 DIAGNOSIS — Z3A01 Less than 8 weeks gestation of pregnancy: Secondary | ICD-10-CM | POA: Diagnosis not present

## 2014-09-06 DIAGNOSIS — R109 Unspecified abdominal pain: Secondary | ICD-10-CM | POA: Diagnosis not present

## 2014-09-06 LAB — WET PREP, GENITAL
CLUE CELLS WET PREP: NONE SEEN
Trich, Wet Prep: NONE SEEN
YEAST WET PREP: NONE SEEN

## 2014-09-06 LAB — URINALYSIS, ROUTINE W REFLEX MICROSCOPIC
BILIRUBIN URINE: NEGATIVE
GLUCOSE, UA: NEGATIVE mg/dL
HGB URINE DIPSTICK: NEGATIVE
KETONES UR: NEGATIVE mg/dL
Leukocytes, UA: NEGATIVE
NITRITE: NEGATIVE
Protein, ur: NEGATIVE mg/dL
UROBILINOGEN UA: 1 mg/dL (ref 0.0–1.0)
pH: 6 (ref 5.0–8.0)

## 2014-09-06 LAB — CBC
HCT: 34.1 % — ABNORMAL LOW (ref 36.0–46.0)
HEMOGLOBIN: 11.5 g/dL — AB (ref 12.0–15.0)
MCH: 27 pg (ref 26.0–34.0)
MCHC: 33.7 g/dL (ref 30.0–36.0)
MCV: 80 fL (ref 78.0–100.0)
Platelets: 332 10*3/uL (ref 150–400)
RBC: 4.26 MIL/uL (ref 3.87–5.11)
RDW: 15.5 % (ref 11.5–15.5)
WBC: 15.3 10*3/uL — ABNORMAL HIGH (ref 4.0–10.5)

## 2014-09-06 LAB — HCG, QUANTITATIVE, PREGNANCY: hCG, Beta Chain, Quant, S: 1924 m[IU]/mL — ABNORMAL HIGH (ref <5)

## 2014-09-06 LAB — OB RESULTS CONSOLE GC/CHLAMYDIA: Gonorrhea: NEGATIVE

## 2014-09-06 LAB — POCT PREGNANCY, URINE: Preg Test, Ur: POSITIVE — AB

## 2014-09-06 LAB — HIV ANTIBODY (ROUTINE TESTING W REFLEX): HIV Screen 4th Generation wRfx: NONREACTIVE

## 2014-09-06 NOTE — MAU Note (Addendum)
Abd cramping and vag d/c with mild odor. Symptoms present for about a wk. IUD came out in large blood clot on 07/24/14. Unknown LMP

## 2014-09-06 NOTE — MAU Provider Note (Signed)
History     CSN: [id number]  Arrival date and time: 09/05/14 2354   First Provider Initiated Contact with Patient 09/06/14 0144      Chief Complaint  Patient presents with  . Abdominal Cramping   HPI   [name] is a 34 y.o. female [phone] at Unknown gestation presenting to MAU with abdominal pain. The abdominal pain/cramping started last week and today the pain became worse. The pain is located in lower part of her abdomen. She tried ibuprofen and this helped a lot. She denies vaginal bleeding.  On June 24th her IUD came out; she had sex 2 days prior to the IUD coming out. The IUD came out in a large blood clot.  She started birth control pills after her IUD came out, she was taking them as prescribed. These were prescribed by her OBGYN.  The patient did not know she was pregnant upon arrival. Pregnancy test results discussed.   OB History    Gravida Para Term Preterm AB TAB SAB Ectopic Multiple Living   8 5 4 1 2 2  0   5      Past Medical History  Diagnosis Date  . Abnormal Pap smear   . Preterm labor   . Fibroids   . Chlamydia   . Candidiasis   . Asthma   . Migraines   . Hypertension     Past Surgical History  Procedure Laterality Date  . Dilation and curettage, diagnostic / therapeutic      TAB  . Dilation and curettage of uterus    . Cesarean section    . No past surgeries      Family History  Problem Relation Age of Onset  . Hypertension Mother   . Hypertension Father   . Cancer Maternal Grandmother   . Hypertension Maternal Grandmother   . Hypertension Maternal Grandfather   . Hypertension Paternal Grandmother   . Heart disease Paternal Grandfather   . Hypertension Paternal Grandfather   . Heart disease Cousin     History  Substance Use Topics  . Smoking status: Never Smoker   . Smokeless tobacco: Never Used  . Alcohol Use: No    Allergies: No Known Allergies  Prescriptions prior to admission  Medication Sig Dispense Refill Last  Dose  . medroxyPROGESTERone (PROVERA) 10 MG tablet Take 1 tablet (10 mg total) by mouth daily. 20 tablet 0   . Multiple Vitamin (MULTIVITAMIN WITH MINERALS) TABS tablet Take 1 tablet by mouth daily.   07/23/2014 at Unknown time  . naproxen sodium (ALEVE) 220 MG tablet Take 440 mg by mouth 2 (two) times daily with a meal.   07/24/2014 at Unknown time   Results for orders placed or performed during the hospital encounter of 09/05/14 (from the past 48 hour(s))  Urinalysis, Routine w reflex microscopic (not at [facility])     Status: Abnormal   Collection Time: 09/06/14 12:50 AM  Result Value Ref Range   Color, Urine YELLOW YELLOW   APPearance CLEAR CLEAR   Specific Gravity, Urine >1.030 (H) 1.005 - 1.030   pH 6.0 5.0 - 8.0   Glucose, UA NEGATIVE NEGATIVE mg/dL   Hgb urine dipstick NEGATIVE NEGATIVE   Bilirubin Urine NEGATIVE NEGATIVE   Ketones, ur NEGATIVE NEGATIVE mg/dL   Protein, ur NEGATIVE NEGATIVE mg/dL   Urobilinogen, UA 1.0 0.0 - 1.0 mg/dL   Nitrite NEGATIVE NEGATIVE   Leukocytes, UA NEGATIVE NEGATIVE    Comment: MICROSCOPIC NOT DONE ON URINES WITH NEGATIVE PROTEIN,  BLOOD, LEUKOCYTES, NITRITE, OR GLUCOSE <1000 mg/dL.  Pregnancy, urine POC     Status: Abnormal   Collection Time: 09/06/14 12:54 AM  Result Value Ref Range   Preg Test, Ur POSITIVE (A) NEGATIVE    Comment:        THE SENSITIVITY OF THIS METHODOLOGY IS >24 mIU/mL   CBC     Status: Abnormal   Collection Time: 09/06/14  1:55 AM  Result Value Ref Range   WBC 15.3 (H) 4.0 - 10.5 K/uL   RBC 4.26 3.87 - 5.11 MIL/uL   Hemoglobin 11.5 (L) 12.0 - 15.0 g/dL   HCT 34.1 (L) 36.0 - 46.0 %   MCV 80.0 78.0 - 100.0 fL   MCH 27.0 26.0 - 34.0 pg   MCHC 33.7 30.0 - 36.0 g/dL   RDW 15.5 11.5 - 15.5 %   Platelets 332 150 - 400 K/uL  hCG, quantitative, pregnancy     Status: Abnormal   Collection Time: 09/06/14  1:55 AM  Result Value Ref Range   hCG, Beta Chain, Quant, S 1924 (H) <5 mIU/mL    Comment:          GEST. AGE      CONC.   (mIU/mL)   <=1 WEEK        5 - 50     2 WEEKS       50 - 500     3 WEEKS       100 - 10,000     4 WEEKS     1,000 - 30,000     5 WEEKS     3,500 - 115,000   6-8 WEEKS     12,000 - 270,000    12 WEEKS     15,000 - 220,000        FEMALE AND NON-PREGNANT FEMALE:     LESS THAN 5 mIU/mL   Wet prep, genital     Status: Abnormal   Collection Time: 09/06/14  1:55 AM  Result Value Ref Range   Yeast Wet Prep HPF POC NONE SEEN NONE SEEN   Trich, Wet Prep NONE SEEN NONE SEEN   Clue Cells Wet Prep HPF POC NONE SEEN NONE SEEN   WBC, Wet Prep HPF POC FEW (A) NONE SEEN    Comment: MODERATE BACTERIA SEEN   US Ob Comp Less 14 Wks  09/06/2014   CLINICAL DATA:  Acute onset of pelvic cramping.  Initial encounter.  EXAM: OBSTETRIC <14 WK [place] AND TRANSVAGINAL OB US  TECHNIQUE: Both transabdominal and transvaginal ultrasound examinations were performed for complete evaluation of the gestation as well as the maternal uterus, adnexal regions, and pelvic cul-de-sac. Transvaginal technique was performed to assess early pregnancy.  COMPARISON:  Pelvic ultrasound performed 07/16/2013, and CT of the abdomen and pelvis performed 01/24/2014  FINDINGS: Intrauterine gestational sac: There is question of a tiny intrauterine gestational sac.  Yolk sac:  No  Embryo:  No  Cardiac Activity: N/A  MSD: 5  mm   5 w   0  d  Maternal uterus/adnexae: No subchorionic hemorrhage is noted. The uterus is otherwise unremarkable.  The ovaries are within normal limits. The right ovary measures 3.3 x 2.6 x 2.5 cm, while the left ovary measures 2.9 x 2.4 x 1.9 cm. No suspicious adnexal masses are seen; there is no evidence for ovarian torsion.  No free fluid is seen within the pelvic cul-de-sac.  IMPRESSION: Question of tiny intrauterine gestational sac, measuring 5 mm in mean sac  diameter. This corresponds to a gestational age of [redacted] weeks 0 days, though it remains too early to determine an estimated date of delivery.   Electronically Signed   By:  Garald Balding M.D.   On: 09/06/2014 02:39   US Ob Transvaginal  09/06/2014   CLINICAL DATA:  Acute onset of pelvic cramping.  Initial encounter.  EXAM: OBSTETRIC <14 WK [place] AND TRANSVAGINAL OB US  TECHNIQUE: Both transabdominal and transvaginal ultrasound examinations were performed for complete evaluation of the gestation as well as the maternal uterus, adnexal regions, and pelvic cul-de-sac. Transvaginal technique was performed to assess early pregnancy.  COMPARISON:  Pelvic ultrasound performed 07/16/2013, and CT of the abdomen and pelvis performed 01/24/2014  FINDINGS: Intrauterine gestational sac: There is question of a tiny intrauterine gestational sac.  Yolk sac:  No  Embryo:  No  Cardiac Activity: N/A  MSD: 5  mm   5 w   0  d  Maternal uterus/adnexae: No subchorionic hemorrhage is noted. The uterus is otherwise unremarkable.  The ovaries are within normal limits. The right ovary measures 3.3 x 2.6 x 2.5 cm, while the left ovary measures 2.9 x 2.4 x 1.9 cm. No suspicious adnexal masses are seen; there is no evidence for ovarian torsion.  No free fluid is seen within the pelvic cul-de-sac.  IMPRESSION: Question of tiny intrauterine gestational sac, measuring 5 mm in mean sac diameter. This corresponds to a gestational age of [redacted] weeks 0 days, though it remains too early to determine an estimated date of delivery.   Electronically Signed   By: Garald Balding M.D.   On: 09/06/2014 02:39   Review of Systems  Constitutional: Negative for fever and chills.  Gastrointestinal: Positive for abdominal pain. Negative for nausea and vomiting.   Physical Exam   Blood pressure 135/87, pulse 106, temperature 98.5 F (36.9 C), resp. rate 20, height 5' 7.5" (1.715 m), weight 143.7 kg (316 lb 12.8 oz), SpO2 100 %, unknown if currently breastfeeding.  Physical Exam  Constitutional: She is oriented to person, place, and time. She appears well-developed and well-nourished. No distress.  HENT:  Head: Normocephalic.   Eyes: Pupils are equal, round, and reactive to light.  Neck: Neck supple.  Respiratory: Effort normal.  GI: There is generalized tenderness. There is no rigidity and no guarding.  Genitourinary:  Wet prep and GC collected without speculum   Musculoskeletal: Normal range of motion.  Neurological: She is alert and oriented to person, place, and time.  Skin: Skin is warm. She is not diaphoretic.  Psychiatric: Her behavior is normal.    MAU Course  Procedures  MDM  Per Dr. Landry Mellow, she would like the patient to follow up with Dr. Nelda Marseille this week in the office.   Assessment and Plan   A:  1. Pregnancy of unknown anatomic location   2. Abdominal pain in pregnancy, antepartum     P:  Discharge home in stable condition Strict ectopic precautions.  Call the office and schedule an appointment with Dr. Nelda Marseille in 64 hours for repeat Quant Return to MAU if symptoms worsen  Pelvic rest   Lezlie Lye, NP 09/06/2014 1:50 AM

## 2014-09-06 NOTE — Discharge Instructions (Signed)

## 2014-09-07 LAB — GC/CHLAMYDIA PROBE AMP (~~LOC~~) NOT AT ARMC
Chlamydia: NEGATIVE
Neisseria Gonorrhea: NEGATIVE

## 2014-09-11 ENCOUNTER — Other Ambulatory Visit (HOSPITAL_COMMUNITY)
Admission: RE | Admit: 2014-09-11 | Discharge: 2014-09-11 | Disposition: A | Discharge status: Home / Self Care | Payer: PRIVATE HEALTH INSURANCE | Source: Ambulatory Visit | Attending: Obstetrics & Gynecology | Admitting: Obstetrics & Gynecology

## 2014-09-11 DIAGNOSIS — O200 Threatened abortion: Secondary | ICD-10-CM | POA: Insufficient documentation

## 2014-09-11 LAB — HCG, QUANTITATIVE, PREGNANCY: HCG, BETA CHAIN, QUANT, S: 10947 m[IU]/mL — AB (ref <5)

## 2014-09-13 ENCOUNTER — Inpatient Hospital Stay (HOSPITAL_COMMUNITY)
Admission: AD | Admit: 2014-09-13 | Discharge: 2014-09-13 | Disposition: A | Discharge status: Home / Self Care | Payer: PRIVATE HEALTH INSURANCE | Source: Ambulatory Visit | Attending: Obstetrics & Gynecology | Admitting: Obstetrics & Gynecology

## 2014-09-13 ENCOUNTER — Inpatient Hospital Stay (HOSPITAL_COMMUNITY): Payer: PRIVATE HEALTH INSURANCE

## 2014-09-13 ENCOUNTER — Encounter (HOSPITAL_COMMUNITY): Payer: Self-pay | Admitting: Family

## 2014-09-13 DIAGNOSIS — O26899 Other specified pregnancy related conditions, unspecified trimester: Secondary | ICD-10-CM

## 2014-09-13 DIAGNOSIS — R109 Unspecified abdominal pain: Secondary | ICD-10-CM

## 2014-09-13 DIAGNOSIS — O9989 Other specified diseases and conditions complicating pregnancy, childbirth and the puerperium: Secondary | ICD-10-CM | POA: Insufficient documentation

## 2014-09-13 DIAGNOSIS — Z3A01 Less than 8 weeks gestation of pregnancy: Secondary | ICD-10-CM | POA: Diagnosis not present

## 2014-09-13 LAB — HCG, QUANTITATIVE, PREGNANCY: HCG, BETA CHAIN, QUANT, S: 15792 m[IU]/mL — AB (ref <5)

## 2014-09-13 NOTE — MAU Note (Signed)
Pt here for follow up HCG levels. Denies vaginal bleeding or pain. States that she sees Dr Nelda Marseille.

## 2014-09-13 NOTE — MAU Provider Note (Signed)
  History     CSN: [id number]  Arrival date and time: 09/13/14 2000   None     No chief complaint on file.  HPI  Pt is [id number] early pregnant  With previous ultrasound on 09/06/2014 shoing [redacted]w[redacted]d question of IUGS- no embryo or cardiac activiity Pt's HCG was 1924. Pt had repeat on 09/11/2014 10947 Pt was told to return to MAU today for repeat HCG RN note: Pt here for follow up HCG levels. Denies vaginal bleeding or pain. States that she sees Dr Nelda Marseille.  Past Medical History  Diagnosis Date  . Abnormal Pap smear   . Preterm labor   . Fibroids   . Chlamydia   . Candidiasis   . Asthma   . Migraines   . Hypertension     Past Surgical History  Procedure Laterality Date  . Dilation and curettage, diagnostic / therapeutic      TAB  . Dilation and curettage of uterus    . Cesarean section    . No past surgeries      Family History  Problem Relation Age of Onset  . Hypertension Mother   . Hypertension Father   . Cancer Maternal Grandmother   . Hypertension Maternal Grandmother   . Hypertension Maternal Grandfather   . Hypertension Paternal Grandmother   . Heart disease Paternal Grandfather   . Hypertension Paternal Grandfather   . Heart disease Cousin     Social History  Substance Use Topics  . Smoking status: Never Smoker   . Smokeless tobacco: Never Used  . Alcohol Use: No    Allergies: No Known Allergies  Prescriptions prior to admission  Medication Sig Dispense Refill Last Dose  . Multiple Vitamin (MULTIVITAMIN WITH MINERALS) TABS tablet Take 1 tablet by mouth daily.   07/23/2014 at Unknown time    ROS Physical Exam   Blood pressure 139/79, pulse 88, temperature 98.8 F (37.1 C), temperature source Oral, resp. rate 20, SpO2 99 %, unknown if currently breastfeeding.  Physical Exam  MAU Course  Procedures   Care handed over to [facility] 09/13/2014, 8:17 PM   Results for orders placed or performed during the hospital encounter  of 09/13/14 (from the past 24 hour(s))  hCG, quantitative, pregnancy     Status: Abnormal   Collection Time: 09/13/14  8:29 PM  Result Value Ref Range   hCG, Beta Chain, Quant, S 15792 (H) <5 mIU/mL   Ultrasound: FINDINGS: Intrauterine gestational sac: Visualized/normal in shape.  Yolk sac: Present  Embryo: Present  Cardiac Activity: Present  Heart Rate: 116 bpm  CRL: 2.1 mm 5 w 6 d [place] EDC: 05/10/2015  Maternal uterus/adnexae: The right ovary is not visualized. Negative left ovary.  IMPRESSION: Single living intrauterine gestation at 5 weeks 6 days. Assessment and Plan  34 y.o. [id number] at [redacted]w[redacted]d IUP Encounter to confirm pregnancy location  Plan: Discharge to home Keep scheduled prenatal care appt Reviewed pregnancy precautions  Gwen Pounds, CNM

## 2014-10-13 ENCOUNTER — Inpatient Hospital Stay (HOSPITAL_COMMUNITY): Payer: PRIVATE HEALTH INSURANCE

## 2014-10-13 ENCOUNTER — Encounter (HOSPITAL_COMMUNITY): Payer: Self-pay | Admitting: *Deleted

## 2014-10-13 ENCOUNTER — Inpatient Hospital Stay (HOSPITAL_COMMUNITY)
Admission: AD | Admit: 2014-10-13 | Discharge: 2014-10-13 | Disposition: A | Discharge status: Home / Self Care | Payer: PRIVATE HEALTH INSURANCE | Source: Ambulatory Visit | Attending: Obstetrics & Gynecology | Admitting: Obstetrics & Gynecology

## 2014-10-13 DIAGNOSIS — R1033 Periumbilical pain: Secondary | ICD-10-CM | POA: Diagnosis not present

## 2014-10-13 DIAGNOSIS — O26891 Other specified pregnancy related conditions, first trimester: Secondary | ICD-10-CM | POA: Diagnosis not present

## 2014-10-13 DIAGNOSIS — Z3A10 10 weeks gestation of pregnancy: Secondary | ICD-10-CM | POA: Insufficient documentation

## 2014-10-13 DIAGNOSIS — R110 Nausea: Secondary | ICD-10-CM | POA: Insufficient documentation

## 2014-10-13 LAB — CBC
HCT: 35.6 % — ABNORMAL LOW (ref 36.0–46.0)
Hemoglobin: 11.6 g/dL — ABNORMAL LOW (ref 12.0–15.0)
MCH: 25.7 pg — ABNORMAL LOW (ref 26.0–34.0)
MCHC: 32.6 g/dL (ref 30.0–36.0)
MCV: 78.9 fL (ref 78.0–100.0)
PLATELETS: 301 10*3/uL (ref 150–400)
RBC: 4.51 MIL/uL (ref 3.87–5.11)
RDW: 15.2 % (ref 11.5–15.5)
WBC: 12.6 10*3/uL — ABNORMAL HIGH (ref 4.0–10.5)

## 2014-10-13 LAB — URINALYSIS, ROUTINE W REFLEX MICROSCOPIC
BILIRUBIN URINE: NEGATIVE
Glucose, UA: NEGATIVE mg/dL
HGB URINE DIPSTICK: NEGATIVE
Ketones, ur: NEGATIVE mg/dL
Leukocytes, UA: NEGATIVE
Nitrite: NEGATIVE
Protein, ur: NEGATIVE mg/dL
SPECIFIC GRAVITY, URINE: 1.02 (ref 1.005–1.030)
UROBILINOGEN UA: 1 mg/dL (ref 0.0–1.0)
pH: 7.5 (ref 5.0–8.0)

## 2014-10-13 MED ORDER — METOCLOPRAMIDE HCL 10 MG PO TABS: 10.0000 mg | ORAL_TABLET | Freq: Four times a day (QID) | ORAL | Status: DC

## 2014-10-13 MED ORDER — METOCLOPRAMIDE HCL 10 MG PO TABS
10.0000 mg | ORAL_TABLET | Freq: Once | ORAL | Status: AC
  Administered 2014-10-13: 10 mg via ORAL
  Filled 2014-10-13: qty 1

## 2014-10-13 NOTE — Discharge Instructions (Signed)

## 2014-10-13 NOTE — MAU Note (Addendum)
Pt C/O nausea since Saturday -no vomiting or diarrhea, has abd pain & tenderness & umbilicus.  Denies bleeding or discharge.  Has been taking phenergan which is not helping.

## 2014-10-13 NOTE — MAU Provider Note (Signed)
History     CSN: [id number]  Arrival date and time: 10/13/14 1117   First Provider Initiated Contact with Patient 10/13/14 1221         Chief Complaint  Patient presents with  . Morning Sickness  . Abdominal Pain   HPI  Savannah Thomas is a 34 y.o. [id number] at [redacted]w[redacted]d who presents with abdominal pain & nausea.  Periumbilical pain since Saturday. Describes as sore & tender to palpation. Rates as 7/10 when touched. Palpation & position changes make pain worse.  Nausea since Saturday. Has taken phenergan with no relief. No vomiting. Denies vomiting/diarrhea/constipation. Denies fever. Denies vaginal bleeding or vaginal discharge.  Denies urinary complaints.      Past Medical History  Diagnosis Date  . Abnormal Pap smear   . Preterm labor   . Fibroids   . Chlamydia   . Candidiasis   . Asthma   . Migraines   . Hypertension     Past Surgical History  Procedure Laterality Date  . Dilation and curettage, diagnostic / therapeutic      TAB  . Dilation and curettage of uterus    . Cesarean section      Family History  Problem Relation Age of Onset  . Hypertension Mother   . Hypertension Father   . Cancer Maternal Grandmother   . Hypertension Maternal Grandmother   . Hypertension Maternal Grandfather   . Hypertension Paternal Grandmother   . Heart disease Paternal Grandfather   . Hypertension Paternal Grandfather   . Heart disease Cousin     Social History  Substance Use Topics  . Smoking status: Never Smoker   . Smokeless tobacco: Never Used  . Alcohol Use: No    Allergies: No Known Allergies  Prescriptions prior to admission  Medication Sig Dispense Refill Last Dose  . Multiple Vitamin (MULTIVITAMIN WITH MINERALS) TABS tablet Take 1 tablet by mouth daily.   10/12/2014 at Unknown time  . promethazine (PHENERGAN) 25 MG tablet Take 25 mg by mouth every 6 (six) hours as needed for nausea or vomiting.   10/13/2014 at Unknown time    Review of Systems   Constitutional: Negative.   Respiratory: Negative.   Cardiovascular: Negative.   Gastrointestinal: Positive for nausea and abdominal pain. Negative for heartburn, vomiting and diarrhea.  Genitourinary: Negative.        Denies vaginal bleeding or discharge.    Physical Exam   Blood pressure 122/71, pulse 86, temperature 98.9 F (37.2 C), temperature source Oral, resp. rate 18, height 5\' 7"  (1.702 m), weight 140.615 kg (310 lb), unknown if currently breastfeeding.  Physical Exam  Nursing note and vitals reviewed. Constitutional: She is oriented to person, place, and time. She appears well-developed and well-nourished. No distress.  HENT:  Head: Normocephalic and atraumatic.  Eyes: Conjunctivae are normal. Right eye exhibits no discharge. Left eye exhibits no discharge. No scleral icterus.  Neck: Normal range of motion.  Cardiovascular: Normal rate, regular rhythm and normal heart sounds.   No murmur heard. Respiratory: Effort normal and breath sounds normal. No respiratory distress. She has no wheezes.  GI: Soft. Bowel sounds are normal. She exhibits no mass. There is tenderness. There is no rebound and no guarding.  Neurological: She is alert and oriented to person, place, and time.  Skin: Skin is warm and dry. She is not diaphoretic.  Psychiatric: She has a normal mood and affect. Her behavior is normal. Judgment and thought content normal.    MAU Course  Procedures  Results for orders placed or performed during the hospital encounter of 10/13/14 (from the past 24 hour(s))  Urinalysis, Routine w reflex microscopic (not at [facility])     Status: None   Collection Time: 10/13/14 11:30 AM  Result Value Ref Range   Color, Urine YELLOW YELLOW   APPearance CLEAR CLEAR   Specific Gravity, Urine 1.020 1.005 - 1.030   pH 7.5 5.0 - 8.0   Glucose, UA NEGATIVE NEGATIVE mg/dL   Hgb urine dipstick NEGATIVE NEGATIVE   Bilirubin Urine NEGATIVE NEGATIVE   Ketones, ur NEGATIVE NEGATIVE mg/dL    Protein, ur NEGATIVE NEGATIVE mg/dL   Urobilinogen, UA 1.0 0.0 - 1.0 mg/dL   Nitrite NEGATIVE NEGATIVE   Leukocytes, UA NEGATIVE NEGATIVE  CBC     Status: Abnormal   Collection Time: 10/13/14 12:37 PM  Result Value Ref Range   WBC 12.6 (H) 4.0 - 10.5 K/uL   RBC 4.51 3.87 - 5.11 MIL/uL   Hemoglobin 11.6 (L) 12.0 - 15.0 g/dL   HCT 35.6 (L) 36.0 - 46.0 %   MCV 78.9 78.0 - 100.0 fL   MCH 25.7 (L) 26.0 - 34.0 pg   MCHC 32.6 30.0 - 36.0 g/dL   RDW 15.2 11.5 - 15.5 %   Platelets 301 150 - 400 K/uL   US Abdomen Limited  10/13/2014   CLINICAL DATA:  Periumbilical pain.  Ten weeks pregnant.  EXAM: LIMITED ULTRASOUND OF ABDOMINAL SOFT TISSUES  TECHNIQUE: Ultrasound examination of the abdominal wall soft tissues was performed in the area of clinical concern.  COMPARISON:  None.  FINDINGS: Ultrasound performed in the periumbilical region. No soft tissue abnormality seen. No soft tissue mass. No visible hernia.  IMPRESSION: No abnormality visualized in the periumbilical region in the area of pain.   Electronically Signed   By: Rolm Baptise M.D.   On: 10/13/2014 13:40    MDM Nausea improved with reglan.  85- C/w Dr. Nelda Marseille; discussed labs & u/s. Will rx reglan & d/c home for patient to be seen in office tomorrow for first OB visit.   Assessment and Plan  A: 1. Periumbilical abdominal pain    P: Discharge home Rx reglan Keep scheduled appointment with [facility] OB[facility]yn tomorrow Discussed reasons to return to MAU.   Jorje Guild, NP  10/13/2014, 12:20 PM

## 2014-11-09 ENCOUNTER — Encounter (HOSPITAL_COMMUNITY): Payer: Self-pay | Admitting: Emergency Medicine

## 2014-11-09 ENCOUNTER — Inpatient Hospital Stay (HOSPITAL_COMMUNITY)
Admission: AD | Admit: 2014-11-09 | Discharge: 2014-11-09 | Discharge status: Against Medical Advice | Payer: PRIVATE HEALTH INSURANCE | Source: Ambulatory Visit | Attending: Obstetrics and Gynecology | Admitting: Obstetrics and Gynecology

## 2014-11-09 DIAGNOSIS — S161XXA Strain of muscle, fascia and tendon at neck level, initial encounter: Secondary | ICD-10-CM | POA: Diagnosis not present

## 2014-11-09 DIAGNOSIS — I10 Essential (primary) hypertension: Secondary | ICD-10-CM | POA: Diagnosis not present

## 2014-11-09 DIAGNOSIS — Y9389 Activity, other specified: Secondary | ICD-10-CM | POA: Diagnosis not present

## 2014-11-09 DIAGNOSIS — Y92410 Unspecified street and highway as the place of occurrence of the external cause: Secondary | ICD-10-CM | POA: Insufficient documentation

## 2014-11-09 DIAGNOSIS — J45909 Unspecified asthma, uncomplicated: Secondary | ICD-10-CM | POA: Insufficient documentation

## 2014-11-09 DIAGNOSIS — O99412 Diseases of the circulatory system complicating pregnancy, second trimester: Secondary | ICD-10-CM | POA: Insufficient documentation

## 2014-11-09 DIAGNOSIS — O9A212 Injury, poisoning and certain other consequences of external causes complicating pregnancy, second trimester: Secondary | ICD-10-CM | POA: Insufficient documentation

## 2014-11-09 DIAGNOSIS — Z86018 Personal history of other benign neoplasm: Secondary | ICD-10-CM | POA: Insufficient documentation

## 2014-11-09 DIAGNOSIS — S299XXA Unspecified injury of thorax, initial encounter: Secondary | ICD-10-CM | POA: Diagnosis not present

## 2014-11-09 DIAGNOSIS — Z3A15 15 weeks gestation of pregnancy: Secondary | ICD-10-CM | POA: Insufficient documentation

## 2014-11-09 DIAGNOSIS — O99512 Diseases of the respiratory system complicating pregnancy, second trimester: Secondary | ICD-10-CM | POA: Diagnosis not present

## 2014-11-09 DIAGNOSIS — Y999 Unspecified external cause status: Secondary | ICD-10-CM | POA: Insufficient documentation

## 2014-11-09 DIAGNOSIS — Z8619 Personal history of other infectious and parasitic diseases: Secondary | ICD-10-CM | POA: Insufficient documentation

## 2014-11-09 DIAGNOSIS — Z5320 Procedure and treatment not carried out because of patient's decision for unspecified reasons: Secondary | ICD-10-CM | POA: Insufficient documentation
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
## 2014-11-09 NOTE — MAU Note (Signed)
Pt called from lobby, pt not there

## 2014-11-10 ENCOUNTER — Emergency Department (HOSPITAL_COMMUNITY)
Admission: EM | Admit: 2014-11-10 | Discharge: 2014-11-10 | Disposition: A | Discharge status: Home / Self Care | Payer: PRIVATE HEALTH INSURANCE | Attending: Emergency Medicine | Admitting: Emergency Medicine

## 2014-11-10 DIAGNOSIS — S161XXA Strain of muscle, fascia and tendon at neck level, initial encounter: Secondary | ICD-10-CM

## 2014-11-10 DIAGNOSIS — M5489 Other dorsalgia: Secondary | ICD-10-CM

## 2014-11-10 DIAGNOSIS — Z3490 Encounter for supervision of normal pregnancy, unspecified, unspecified trimester: Secondary | ICD-10-CM

## 2014-11-10 MED ORDER — ACETAMINOPHEN 500 MG PO TABS
1000.0000 mg | ORAL_TABLET | Freq: Once | ORAL | Status: AC
Start: 2014-11-10 — End: 2014-11-10
  Administered 2014-11-10: 1000 mg via ORAL
  Filled 2014-11-10: qty 2
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
## 2014-11-10 NOTE — ED Notes (Signed)
Pt ambulating independently w/ steady gait on d/c in no acute distress, A&Ox4. D/c instructions reviewed w/ pt - pt denies any further questions or concerns at present.  
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
## 2014-11-27 LAB — OB RESULTS CONSOLE HIV ANTIBODY (ROUTINE TESTING): HIV: NONREACTIVE

## 2014-11-27 LAB — OB RESULTS CONSOLE ABO/RH: RH TYPE: POSITIVE

## 2014-11-27 LAB — OB RESULTS CONSOLE RUBELLA ANTIBODY, IGM: Rubella: IMMUNE

## 2014-11-27 LAB — OB RESULTS CONSOLE RPR: RPR: NONREACTIVE

## 2014-11-27 LAB — OB RESULTS CONSOLE ANTIBODY SCREEN: Antibody Screen: NEGATIVE

## 2014-11-27 LAB — OB RESULTS CONSOLE HEPATITIS B SURFACE ANTIGEN: HEP B S AG: NEGATIVE

## 2014-12-08 ENCOUNTER — Encounter (HOSPITAL_COMMUNITY): Payer: Self-pay

## 2014-12-08 ENCOUNTER — Inpatient Hospital Stay (HOSPITAL_COMMUNITY)
Admission: AD | Admit: 2014-12-08 | Discharge: 2014-12-08 | Disposition: A | Discharge status: Home / Self Care | Payer: PRIVATE HEALTH INSURANCE | Source: Ambulatory Visit | Attending: Obstetrics and Gynecology | Admitting: Obstetrics and Gynecology

## 2014-12-08 DIAGNOSIS — O2312 Infections of bladder in pregnancy, second trimester: Secondary | ICD-10-CM | POA: Insufficient documentation

## 2014-12-08 DIAGNOSIS — R109 Unspecified abdominal pain: Secondary | ICD-10-CM | POA: Diagnosis present

## 2014-12-08 DIAGNOSIS — Z3A18 18 weeks gestation of pregnancy: Secondary | ICD-10-CM | POA: Diagnosis not present

## 2014-12-08 DIAGNOSIS — N3000 Acute cystitis without hematuria: Secondary | ICD-10-CM

## 2014-12-08 LAB — URINALYSIS, ROUTINE W REFLEX MICROSCOPIC
BILIRUBIN URINE: NEGATIVE
Glucose, UA: NEGATIVE mg/dL
Ketones, ur: >80 mg/dL — AB
Nitrite: POSITIVE — AB
Protein, ur: 100 mg/dL — AB
SPECIFIC GRAVITY, URINE: 1.02 (ref 1.005–1.030)
UROBILINOGEN UA: 0.2 mg/dL (ref 0.0–1.0)
pH: 6 (ref 5.0–8.0)

## 2014-12-08 LAB — URINE MICROSCOPIC-ADD ON

## 2014-12-08 MED ORDER — CEPHALEXIN 500 MG PO CAPS: 500.0000 mg | ORAL_CAPSULE | Freq: Four times a day (QID) | ORAL | Status: DC

## 2014-12-08 MED ORDER — HYDROCODONE-ACETAMINOPHEN 5-325 MG PO TABS: 1.0000 | ORAL_TABLET | Freq: Four times a day (QID) | ORAL | Status: DC | PRN

## 2014-12-08 NOTE — MAU Note (Signed)
LLQ pain & cramping for the last 4 hours, denies vag bleeding.  Has some tingling with urination.  No N, V, or D.

## 2014-12-08 NOTE — Discharge Instructions (Signed)
Pregnancy and Urinary Tract Infection °A urinary tract infection (UTI) is a bacterial infection of the urinary tract. Infection of the urinary tract can include the ureters, kidneys (pyelonephritis), bladder (cystitis), and urethra (urethritis). All pregnant women should be screened for bacteria in the urinary tract. Identifying and treating a UTI will decrease the risk of preterm labor and developing more serious infections in both the mother and baby. °CAUSES °Bacteria germs cause almost all UTIs.  °RISK FACTORS °Many factors can increase your chances of getting a UTI during pregnancy. These include: °· Having a short urethra. °· Poor toilet and hygiene habits. °· Sexual intercourse. °· Blockage of urine along the urinary tract. °· Problems with the pelvic muscles or nerves. °· Diabetes. °· Obesity. °· Bladder problems after having several children. °· Previous history of UTI. °SIGNS AND SYMPTOMS  °· Pain, burning, or a stinging feeling when urinating. °· Suddenly feeling the need to urinate right away (urgency). °· Loss of bladder control (urinary incontinence). °· Frequent urination, more than is common with pregnancy. °· Lower abdominal or back discomfort. °· Cloudy urine. °· Blood in the urine (hematuria). °· Fever.  °When the kidneys are infected, the symptoms may be: °· Back pain. °· Flank pain on the right side more so than the left. °· Fever. °· Chills. °· Nausea. °· Vomiting. °DIAGNOSIS  °A urinary tract infection is usually diagnosed through urine tests. Additional tests and procedures are sometimes done. These may include: °· Ultrasound exam of the kidneys, ureters, bladder, and urethra. °· Looking in the bladder with a lighted tube (cystoscopy). °TREATMENT °Typically, UTIs can be treated with antibiotic medicines.  °HOME CARE INSTRUCTIONS  °· Only take over-the-counter or prescription medicines as directed by your health care provider. If you were prescribed antibiotics, take them as directed. Finish  them even if you start to feel better. °· Drink enough fluids to keep your urine clear or pale yellow. °· Do not have sexual intercourse until the infection is gone and your health care provider says it is okay. °· Make sure you are tested for UTIs throughout your pregnancy. These infections often come back.  °Preventing a UTI in the Future °· Practice good toilet habits. Always wipe from front to back. Use the tissue only once. °· Do not hold your urine. Empty your bladder as soon as possible when the urge comes. °· Do not douche or use deodorant sprays. °· Wash with soap and warm water around the genital area and the anus. °· Empty your bladder before and after sexual intercourse. °· Wear underwear with a cotton crotch. °· Avoid caffeine and carbonated drinks. They can irritate the bladder. °· Drink cranberry juice or take cranberry pills. This may decrease the risk of getting a UTI. °· Do not drink alcohol. °· Keep all your appointments and tests as scheduled.  °SEEK MEDICAL CARE IF:  °· Your symptoms get worse. °· You are still having fevers 2 or more days after treatment begins. °· You have a rash. °· You feel that you are having problems with medicines prescribed. °· You have abnormal vaginal discharge. °SEEK IMMEDIATE MEDICAL CARE IF:  °· You have back or flank pain. °· You have chills. °· You have blood in your urine. °· You have nausea and vomiting. °· You have contractions of your uterus. °· You have a gush of fluid from the vagina. °MAKE SURE YOU: °· Understand these instructions.   °· Will watch your condition.   °· Will get help right away if you are not doing   pain.  · You have chills.  · You have blood in your urine.  · You have nausea and vomiting.  · You have contractions of your uterus.  · You have a gush of fluid from the vagina.  MAKE SURE YOU:  · Understand these instructions.    · Will watch your condition.    · Will get help right away if you are not doing well or get worse.       This information is not intended to replace advice given to you by your health care provider. Make sure you discuss any questions you have with your health care provider.     Document Released: 05/13/2010 Document Revised: 11/06/2012 Document Reviewed: 08/15/2012  Elsevier Interactive Patient Education ©2016 Elsevier  Inc.

## 2014-12-08 NOTE — MAU Provider Note (Signed)
History     CSN: [id number] Arrival date and time: 12/08/14 1649 First Provider Initiated Contact with Patient 12/08/14 1755      Chief Complaint  Patient presents with  . Abdominal Pain   HPI Patient is 34 y.o. [id number] [redacted]w[redacted]d here with complaints of abdominal pain with burning/tingling with urination for the past 3-4 days.  Reports she has has had a UTI before but not in pregnancy. Denies fever, chills, nausea, vomiting, flank pain. Reports pain located in the lower left side of abdomen. She denies history of kidney stones.   +FM, denies LOF, VB, contractions, vaginal discharge.   OB History    Gravida Para Term Preterm AB TAB SAB Ectopic Multiple Living   8 5 4 1 2 2  0   5      Past Medical History  Diagnosis Date  . Abnormal Pap smear   . Preterm labor   . Fibroids   . Chlamydia   . Candidiasis   . Asthma   . Migraines   . Hypertension     Past Surgical History  Procedure Laterality Date  . Dilation and curettage, diagnostic / therapeutic      TAB  . Dilation and curettage of uterus    . Cesarean section      Family History  Problem Relation Age of Onset  . Hypertension Mother   . Hypertension Father   . Cancer Maternal Grandmother   . Hypertension Maternal Grandmother   . Hypertension Maternal Grandfather   . Hypertension Paternal Grandmother   . Heart disease Paternal Grandfather   . Hypertension Paternal Grandfather   . Heart disease Cousin     Social History  Substance Use Topics  . Smoking status: Never Smoker   . Smokeless tobacco: Never Used  . Alcohol Use: No    Allergies: No Known Allergies  Prescriptions prior to admission  Medication Sig Dispense Refill Last Dose  . Prenatal Vit-Fe Fumarate-FA (PRENATAL MULTIVITAMIN) TABS tablet Take 1 tablet by mouth daily at 12 noon.   12/08/2014 at 0800  . metoCLOPramide (REGLAN) 10 MG tablet Take 1 tablet (10 mg total) by mouth every 6 (six) hours. (Patient not taking: Reported on 11/10/2014) 30  tablet 0 Not Taking at Unknown time    Review of Systems  Constitutional: Negative for fever and chills.  Eyes: Negative for blurred vision and double vision.  Respiratory: Negative for cough and shortness of breath.   Cardiovascular: Negative for chest pain and orthopnea.  Gastrointestinal: Positive for abdominal pain (LLQ and suprapubic). Negative for nausea and vomiting.  Genitourinary: Negative for dysuria, frequency and flank pain.  Musculoskeletal: Negative for myalgias.  Skin: Negative for rash.  Neurological: Negative for dizziness, tingling, weakness and headaches.  Endo/Heme/Allergies: Does not bruise/bleed easily.  Psychiatric/Behavioral: Negative for depression and suicidal ideas. The patient is not nervous/anxious.    Physical Exam   Blood pressure 115/73, pulse 118, temperature 98.4 F (36.9 C), temperature source Oral, resp. rate 18, height 5' 7.5" (1.715 m), weight 314 lb (142.429 kg), unknown if currently breastfeeding.  Body mass index is 48.43 kg/(m^2).   Physical Exam  Nursing note and vitals reviewed. Constitutional: She is oriented to person, place, and time. She appears well-developed and well-nourished. No distress.  HENT:  Head: Normocephalic and atraumatic.  Eyes: Conjunctivae are normal. No scleral icterus.  Neck: Normal range of motion. Neck supple.  Cardiovascular: Normal rate and intact distal pulses.   Respiratory: Effort normal. She exhibits no tenderness.  GI: Soft.  There is tenderness (TTP in suprapubic region). There is no rebound and no guarding.  Obese abdomen. No CVA tenderness  Genitourinary: Vagina normal.  Musculoskeletal: Normal range of motion. She exhibits no edema.  Neurological: She is alert and oriented to person, place, and time.  Skin: Skin is warm and dry. No rash noted.  Psychiatric: She has a normal mood and affect.    MAU Course  Procedures  MDM  Results for orders placed or performed during the hospital encounter of  12/08/14 (from the past 24 hour(s))  Urinalysis, Routine w reflex microscopic (not at [facility])     Status: Abnormal   Collection Time: 12/08/14  5:05 PM  Result Value Ref Range   Color, Urine YELLOW YELLOW   APPearance CLOUDY (A) CLEAR   Specific Gravity, Urine 1.020 1.005 - 1.030   pH 6.0 5.0 - 8.0   Glucose, UA NEGATIVE NEGATIVE mg/dL   Hgb urine dipstick LARGE (A) NEGATIVE   Bilirubin Urine NEGATIVE NEGATIVE   Ketones, ur >80 (A) NEGATIVE mg/dL   Protein, ur 100 (A) NEGATIVE mg/dL   Urobilinogen, UA 0.2 0.0 - 1.0 mg/dL   Nitrite POSITIVE (A) NEGATIVE   Leukocytes, UA MODERATE (A) NEGATIVE  Urine microscopic-add on     Status: Abnormal   Collection Time: 12/08/14  5:05 PM  Result Value Ref Range   Squamous Epithelial / LPF RARE RARE   WBC, UA 0-2 <3 WBC/hpf   RBC / HPF 7-10 <3 RBC/hpf   Bacteria, UA FEW (A) RARE   Urine-Other MUCOUS PRESENT     Assessment and Plan  HERMIONE HAVLICEK is a 34 y.o. [id number] at [redacted]w[redacted]d presenting with abdominal pain and UA that is frankly positive for signs of infection including positive nitrites which is suggestive of ecoli, no prior urine culture results in our system to compare.   #UTI, acute cystitis -Kelfex 500 qid x7 days - Follow up urine culture for sensitivities -Reviewed using tylenol for pain and provided #5 tablets of norco -Reviewed pyelonephritis precautions and patient voiced understanding  Caren Macadam 12/08/2014, 5:55 PM

## 2014-12-15 ENCOUNTER — Other Ambulatory Visit (HOSPITAL_COMMUNITY): Payer: Self-pay | Admitting: Obstetrics & Gynecology

## 2014-12-15 DIAGNOSIS — Z3689 Encounter for other specified antenatal screening: Secondary | ICD-10-CM

## 2014-12-29 ENCOUNTER — Ambulatory Visit (HOSPITAL_COMMUNITY): Payer: PRIVATE HEALTH INSURANCE

## 2014-12-30 ENCOUNTER — Other Ambulatory Visit (HOSPITAL_COMMUNITY): Payer: Self-pay | Admitting: Obstetrics & Gynecology

## 2014-12-30 ENCOUNTER — Ambulatory Visit (HOSPITAL_COMMUNITY)
Admission: RE | Admit: 2014-12-30 | Discharge: 2014-12-30 | Disposition: A | Discharge status: Home / Self Care | Payer: PRIVATE HEALTH INSURANCE | Source: Ambulatory Visit | Attending: Obstetrics & Gynecology | Admitting: Obstetrics & Gynecology

## 2014-12-30 DIAGNOSIS — Z36 Encounter for antenatal screening of mother: Secondary | ICD-10-CM | POA: Diagnosis not present

## 2014-12-30 DIAGNOSIS — O34219 Maternal care for unspecified type scar from previous cesarean delivery: Secondary | ICD-10-CM

## 2014-12-30 DIAGNOSIS — Z3A21 21 weeks gestation of pregnancy: Secondary | ICD-10-CM | POA: Insufficient documentation

## 2014-12-30 DIAGNOSIS — Z3689 Encounter for other specified antenatal screening: Secondary | ICD-10-CM

## 2014-12-30 DIAGNOSIS — O99212 Obesity complicating pregnancy, second trimester: Secondary | ICD-10-CM | POA: Insufficient documentation

## 2015-01-31 NOTE — L&D Delivery Note (Signed)
Delivery Note At 4:42 AM a viable female "Savannah Thomas" was delivered precipitously via Vaginal, Spontaneous Delivery (Presentation: OA restituting to Left Occiput Anterior).  APGARS: 8,9; weight 8 lb 2 oz (3685 g).   Placenta status: Intact, Spontaneous.  Cord: 3 vessels with the following complications: None.  Cord pH: NA  Anesthesia: None  Episiotomy: None Lacerations: None Suture Repair: NA Est. Blood Loss (mL): 25  Mom to postpartum.  Baby to Couplet care / Skin to Skin.  Mom plans to breast and bottle feed.  Planning tubal for contraception.  Farrel Gordon 05/02/2015, 5:07 AM

## 2015-04-22 ENCOUNTER — Other Ambulatory Visit (HOSPITAL_COMMUNITY): Payer: Self-pay | Admitting: Obstetrics & Gynecology

## 2015-04-22 DIAGNOSIS — Z3A34 34 weeks gestation of pregnancy: Secondary | ICD-10-CM

## 2015-04-22 DIAGNOSIS — O99213 Obesity complicating pregnancy, third trimester: Secondary | ICD-10-CM

## 2015-04-22 DIAGNOSIS — Z3689 Encounter for other specified antenatal screening: Secondary | ICD-10-CM

## 2015-04-26 ENCOUNTER — Encounter (HOSPITAL_COMMUNITY): Payer: Self-pay | Admitting: *Deleted

## 2015-04-26 ENCOUNTER — Telehealth (HOSPITAL_COMMUNITY): Payer: Self-pay | Admitting: *Deleted

## 2015-04-26 NOTE — Telephone Encounter (Signed)
Preadmission screen  

## 2015-04-28 ENCOUNTER — Inpatient Hospital Stay (HOSPITAL_COMMUNITY): Admission: RE | Admit: 2015-04-28 | Payer: No Typology Code available for payment source | Source: Ambulatory Visit

## 2015-04-29 ENCOUNTER — Other Ambulatory Visit (HOSPITAL_COMMUNITY): Payer: Self-pay | Admitting: Obstetrics & Gynecology

## 2015-04-29 ENCOUNTER — Ambulatory Visit (HOSPITAL_COMMUNITY)
Admission: RE | Admit: 2015-04-29 | Discharge: 2015-04-29 | Disposition: A | Discharge status: Home / Self Care | Payer: PRIVATE HEALTH INSURANCE | Source: Ambulatory Visit | Attending: Obstetrics & Gynecology | Admitting: Obstetrics & Gynecology

## 2015-04-29 DIAGNOSIS — O26843 Uterine size-date discrepancy, third trimester: Secondary | ICD-10-CM | POA: Diagnosis present

## 2015-04-29 DIAGNOSIS — O99213 Obesity complicating pregnancy, third trimester: Secondary | ICD-10-CM

## 2015-04-29 DIAGNOSIS — Z36 Encounter for antenatal screening of mother: Secondary | ICD-10-CM | POA: Diagnosis not present

## 2015-04-29 DIAGNOSIS — O09213 Supervision of pregnancy with history of pre-term labor, third trimester: Secondary | ICD-10-CM | POA: Insufficient documentation

## 2015-04-29 DIAGNOSIS — IMO0002 Reserved for concepts with insufficient information to code with codable children: Secondary | ICD-10-CM

## 2015-04-29 DIAGNOSIS — Z0489 Encounter for examination and observation for other specified reasons: Secondary | ICD-10-CM

## 2015-04-29 DIAGNOSIS — Z3A34 34 weeks gestation of pregnancy: Secondary | ICD-10-CM

## 2015-04-29 DIAGNOSIS — Z3A38 38 weeks gestation of pregnancy: Secondary | ICD-10-CM | POA: Diagnosis not present

## 2015-04-29 DIAGNOSIS — O34219 Maternal care for unspecified type scar from previous cesarean delivery: Secondary | ICD-10-CM

## 2015-04-29 DIAGNOSIS — Z8751 Personal history of pre-term labor: Secondary | ICD-10-CM

## 2015-04-30 ENCOUNTER — Ambulatory Visit (HOSPITAL_COMMUNITY): Payer: No Typology Code available for payment source

## 2015-05-02 ENCOUNTER — Inpatient Hospital Stay (HOSPITAL_COMMUNITY): Payer: PRIVATE HEALTH INSURANCE | Admitting: Anesthesiology

## 2015-05-02 ENCOUNTER — Encounter (HOSPITAL_COMMUNITY): Payer: Self-pay | Admitting: Anesthesiology

## 2015-05-02 ENCOUNTER — Inpatient Hospital Stay (HOSPITAL_COMMUNITY)
Admission: AD | Admit: 2015-05-02 | Discharge: 2015-05-04 | DRG: 775 | Disposition: A | Discharge status: Home / Self Care | Payer: PRIVATE HEALTH INSURANCE | Source: Ambulatory Visit | Attending: Obstetrics & Gynecology | Admitting: Obstetrics & Gynecology

## 2015-05-02 DIAGNOSIS — J45909 Unspecified asthma, uncomplicated: Secondary | ICD-10-CM

## 2015-05-02 DIAGNOSIS — O9952 Diseases of the respiratory system complicating childbirth: Secondary | ICD-10-CM | POA: Diagnosis present

## 2015-05-02 DIAGNOSIS — Z833 Family history of diabetes mellitus: Secondary | ICD-10-CM

## 2015-05-02 DIAGNOSIS — Z98891 History of uterine scar from previous surgery: Secondary | ICD-10-CM

## 2015-05-02 DIAGNOSIS — O9962 Diseases of the digestive system complicating childbirth: Secondary | ICD-10-CM | POA: Diagnosis present

## 2015-05-02 DIAGNOSIS — O99824 Streptococcus B carrier state complicating childbirth: Secondary | ICD-10-CM | POA: Diagnosis present

## 2015-05-02 DIAGNOSIS — D649 Anemia, unspecified: Secondary | ICD-10-CM | POA: Diagnosis present

## 2015-05-02 DIAGNOSIS — K219 Gastro-esophageal reflux disease without esophagitis: Secondary | ICD-10-CM | POA: Diagnosis present

## 2015-05-02 DIAGNOSIS — O9902 Anemia complicating childbirth: Secondary | ICD-10-CM | POA: Diagnosis present

## 2015-05-02 DIAGNOSIS — Z6841 Body Mass Index (BMI) 40.0 and over, adult: Secondary | ICD-10-CM | POA: Diagnosis not present

## 2015-05-02 DIAGNOSIS — Z8669 Personal history of other diseases of the nervous system and sense organs: Secondary | ICD-10-CM

## 2015-05-02 DIAGNOSIS — O34211 Maternal care for low transverse scar from previous cesarean delivery: Principal | ICD-10-CM | POA: Diagnosis present

## 2015-05-02 DIAGNOSIS — O99214 Obesity complicating childbirth: Secondary | ICD-10-CM | POA: Diagnosis present

## 2015-05-02 DIAGNOSIS — Z3A39 39 weeks gestation of pregnancy: Secondary | ICD-10-CM | POA: Diagnosis not present

## 2015-05-02 DIAGNOSIS — Z8249 Family history of ischemic heart disease and other diseases of the circulatory system: Secondary | ICD-10-CM

## 2015-05-02 DIAGNOSIS — O99019 Anemia complicating pregnancy, unspecified trimester: Secondary | ICD-10-CM

## 2015-05-02 LAB — COMPREHENSIVE METABOLIC PANEL WITH GFR
ALT: 11 U/L — ABNORMAL LOW (ref 14–54)
AST: 22 U/L (ref 15–41)
Albumin: 2.7 g/dL — ABNORMAL LOW (ref 3.5–5.0)
Alkaline Phosphatase: 122 U/L (ref 38–126)
Anion gap: 7 (ref 5–15)
BUN: 5 mg/dL — ABNORMAL LOW (ref 6–20)
CO2: 21 mmol/L — ABNORMAL LOW (ref 22–32)
Calcium: 8.6 mg/dL — ABNORMAL LOW (ref 8.9–10.3)
Chloride: 104 mmol/L (ref 101–111)
Creatinine, Ser: 0.55 mg/dL (ref 0.44–1.00)
GFR calc Af Amer: >60 mL/min
GFR calc non Af Amer: >60 mL/min
Glucose, Bld: 106 mg/dL — ABNORMAL HIGH (ref 65–99)
Potassium: 3.8 mmol/L (ref 3.5–5.1)
Sodium: 132 mmol/L — ABNORMAL LOW (ref 135–145)
Total Bilirubin: 0.4 mg/dL (ref 0.3–1.2)
Total Protein: 6.9 g/dL (ref 6.5–8.1)

## 2015-05-02 LAB — CBC
HEMATOCRIT: 30.6 % — AB (ref 36.0–46.0)
HEMOGLOBIN: 9.5 g/dL — AB (ref 12.0–15.0)
MCH: 22.7 pg — AB (ref 26.0–34.0)
MCHC: 31 g/dL (ref 30.0–36.0)
MCV: 73.2 fL — AB (ref 78.0–100.0)
Platelets: 293 10*3/uL (ref 150–400)
RBC: 4.18 MIL/uL (ref 3.87–5.11)
RDW: 17.1 % — ABNORMAL HIGH (ref 11.5–15.5)
WBC: 11.5 10*3/uL — AB (ref 4.0–10.5)

## 2015-05-02 LAB — SYPHILIS: RPR W/REFLEX TO RPR TITER AND TREPONEMAL ANTIBODIES, TRADITIONAL SCREENING AND DIAGNOSIS ALGORITHM: RPR Ser Ql: NONREACTIVE

## 2015-05-02 LAB — TYPE AND SCREEN
ABO/RH(D): A POS
Antibody Screen: NEGATIVE

## 2015-05-02 LAB — URIC ACID: Uric Acid, Serum: 4.9 mg/dL (ref 2.3–6.6)

## 2015-05-02 LAB — LACTATE DEHYDROGENASE: LDH: 183 U/L (ref 98–192)

## 2015-05-02 MED ORDER — OXYCODONE-ACETAMINOPHEN 5-325 MG PO TABS
1.0000 | ORAL_TABLET | ORAL | Status: DC | PRN
  Administered 2015-05-02 – 2015-05-04 (×3): 1 via ORAL
  Filled 2015-05-02 (×5): qty 1

## 2015-05-02 MED ORDER — HYDRALAZINE HCL 20 MG/ML IJ SOLN: 10.0000 mg | Freq: Once | INTRAMUSCULAR | Status: DC | PRN

## 2015-05-02 MED ORDER — SENNOSIDES-DOCUSATE SODIUM 8.6-50 MG PO TABS
2.0000 | ORAL_TABLET | ORAL | Status: DC
  Administered 2015-05-02 – 2015-05-04 (×2): 2 via ORAL
  Filled 2015-05-02 (×2): qty 2

## 2015-05-02 MED ORDER — EPHEDRINE 5 MG/ML INJ
10.0000 mg | INTRAVENOUS | Status: DC | PRN
  Filled 2015-05-02: qty 2

## 2015-05-02 MED ORDER — OXYTOCIN BOLUS FROM INFUSION
500.0000 mL | INTRAVENOUS | Status: DC
  Administered 2015-05-02: 500 mL via INTRAVENOUS

## 2015-05-02 MED ORDER — DIPHENHYDRAMINE HCL 50 MG/ML IJ SOLN: 12.5000 mg | INTRAMUSCULAR | Status: DC | PRN

## 2015-05-02 MED ORDER — LACTATED RINGERS IV SOLN
500.0000 mL | INTRAVENOUS | Status: DC | PRN
  Administered 2015-05-02: 500 mL via INTRAVENOUS

## 2015-05-02 MED ORDER — SIMETHICONE 80 MG PO CHEW: 80.0000 mg | CHEWABLE_TABLET | ORAL | Status: DC | PRN

## 2015-05-02 MED ORDER — TETANUS-DIPHTH-ACELL PERTUSSIS 5-2.5-18.5 LF-MCG/0.5 IM SUSP: 0.5000 mL | Freq: Once | INTRAMUSCULAR | Status: DC

## 2015-05-02 MED ORDER — ZOLPIDEM TARTRATE 5 MG PO TABS: 5.0000 mg | ORAL_TABLET | Freq: Every evening | ORAL | Status: DC | PRN

## 2015-05-02 MED ORDER — FENTANYL 2.5 MCG/ML BUPIVACAINE 1/10 % EPIDURAL INFUSION (WH - ANES): 14.0000 mL/h | INTRAMUSCULAR | Status: DC | PRN

## 2015-05-02 MED ORDER — LACTATED RINGERS IV SOLN: 500.0000 mL | Freq: Once | INTRAVENOUS | Status: DC

## 2015-05-02 MED ORDER — OXYTOCIN 10 UNIT/ML IJ SOLN
2.5000 [IU]/h | INTRAVENOUS | Status: DC
  Filled 2015-05-02: qty 4

## 2015-05-02 MED ORDER — DIBUCAINE 1 % RE OINT: 1.0000 "application " | TOPICAL_OINTMENT | RECTAL | Status: DC | PRN

## 2015-05-02 MED ORDER — ACETAMINOPHEN 325 MG PO TABS: 650.0000 mg | ORAL_TABLET | ORAL | Status: DC | PRN

## 2015-05-02 MED ORDER — PHENYLEPHRINE 40 MCG/ML (10ML) SYRINGE FOR IV PUSH (FOR BLOOD PRESSURE SUPPORT)
PREFILLED_SYRINGE | INTRAVENOUS | Status: AC
  Filled 2015-05-02: qty 10

## 2015-05-02 MED ORDER — ONDANSETRON HCL 4 MG PO TABS: 4.0000 mg | ORAL_TABLET | ORAL | Status: DC | PRN

## 2015-05-02 MED ORDER — PHENYLEPHRINE 40 MCG/ML (10ML) SYRINGE FOR IV PUSH (FOR BLOOD PRESSURE SUPPORT)
80.0000 ug | PREFILLED_SYRINGE | INTRAVENOUS | Status: DC | PRN
  Filled 2015-05-02: qty 2

## 2015-05-02 MED ORDER — OXYCODONE-ACETAMINOPHEN 5-325 MG PO TABS
2.0000 | ORAL_TABLET | ORAL | Status: DC | PRN
  Administered 2015-05-02 – 2015-05-03 (×2): 2 via ORAL
  Filled 2015-05-02: qty 2

## 2015-05-02 MED ORDER — ONDANSETRON HCL 4 MG/2ML IJ SOLN: 4.0000 mg | Freq: Four times a day (QID) | INTRAMUSCULAR | Status: DC | PRN

## 2015-05-02 MED ORDER — WITCH HAZEL-GLYCERIN EX PADS: 1.0000 "application " | MEDICATED_PAD | CUTANEOUS | Status: DC | PRN

## 2015-05-02 MED ORDER — FERROUS SULFATE 325 (65 FE) MG PO TABS
325.0000 mg | ORAL_TABLET | Freq: Two times a day (BID) | ORAL | Status: DC
  Administered 2015-05-02 – 2015-05-04 (×5): 325 mg via ORAL
  Filled 2015-05-02 (×6): qty 1

## 2015-05-02 MED ORDER — OXYCODONE-ACETAMINOPHEN 5-325 MG PO TABS
2.0000 | ORAL_TABLET | ORAL | Status: DC | PRN
  Administered 2015-05-02: 2 via ORAL
  Filled 2015-05-02: qty 2

## 2015-05-02 MED ORDER — OXYCODONE-ACETAMINOPHEN 5-325 MG PO TABS: 1.0000 | ORAL_TABLET | ORAL | Status: DC | PRN

## 2015-05-02 MED ORDER — LACTATED RINGERS IV SOLN
INTRAVENOUS | Status: DC
  Administered 2015-05-02 (×2): via INTRAVENOUS

## 2015-05-02 MED ORDER — LIDOCAINE HCL (PF) 1 % IJ SOLN
30.0000 mL | INTRAMUSCULAR | Status: DC | PRN
  Filled 2015-05-02: qty 30

## 2015-05-02 MED ORDER — FLEET ENEMA 7-19 GM/118ML RE ENEM: 1.0000 | ENEMA | RECTAL | Status: DC | PRN

## 2015-05-02 MED ORDER — FENTANYL CITRATE (PF) 100 MCG/2ML IJ SOLN: 50.0000 ug | INTRAMUSCULAR | Status: DC | PRN

## 2015-05-02 MED ORDER — CITRIC ACID-SODIUM CITRATE 334-500 MG/5ML PO SOLN: 30.0000 mL | ORAL | Status: DC | PRN

## 2015-05-02 MED ORDER — LABETALOL HCL 5 MG/ML IV SOLN: 20.0000 mg | INTRAVENOUS | Status: DC | PRN

## 2015-05-02 MED ORDER — BENZOCAINE-MENTHOL 20-0.5 % EX AERO
1.0000 "application " | INHALATION_SPRAY | CUTANEOUS | Status: DC | PRN
  Administered 2015-05-03: 1 via TOPICAL
  Filled 2015-05-02: qty 56

## 2015-05-02 MED ORDER — LANOLIN HYDROUS EX OINT
TOPICAL_OINTMENT | CUTANEOUS | Status: DC | PRN
Start: 2015-05-02 — End: 2015-05-04

## 2015-05-02 MED ORDER — PRENATAL MULTIVITAMIN CH
1.0000 | ORAL_TABLET | Freq: Every day | ORAL | Status: DC
  Administered 2015-05-02 – 2015-05-04 (×3): 1 via ORAL
  Filled 2015-05-02 (×3): qty 1

## 2015-05-02 MED ORDER — DIPHENHYDRAMINE HCL 25 MG PO CAPS: 25.0000 mg | ORAL_CAPSULE | Freq: Four times a day (QID) | ORAL | Status: DC | PRN

## 2015-05-02 MED ORDER — LIDOCAINE HCL (PF) 1 % IJ SOLN
INTRAMUSCULAR | Status: DC | PRN
  Administered 2015-05-02 (×2): 4 mL via EPIDURAL

## 2015-05-02 MED ORDER — FENTANYL 2.5 MCG/ML BUPIVACAINE 1/10 % EPIDURAL INFUSION (WH - ANES)
INTRAMUSCULAR | Status: AC
  Filled 2015-05-02: qty 125

## 2015-05-02 MED ORDER — IBUPROFEN 600 MG PO TABS
600.0000 mg | ORAL_TABLET | Freq: Four times a day (QID) | ORAL | Status: DC
  Administered 2015-05-02 – 2015-05-04 (×10): 600 mg via ORAL
  Filled 2015-05-02 (×10): qty 1

## 2015-05-02 MED ORDER — ONDANSETRON HCL 4 MG/2ML IJ SOLN
4.0000 mg | INTRAMUSCULAR | Status: DC | PRN
Start: 2015-05-02 — End: 2015-05-04

## 2015-05-02 NOTE — Lactation Note (Signed)
This note was copied from a baby's chart. Lactation Consultation Note  Patient Name: Savannah Thomas [id number] Date: 05/02/2015 Reason for consult: Initial assessment This is Mom's 6th baby, she is experienced BF and plans to BR/BO with this baby. LC advised Mom to BF with each feeding before giving supplement to encourage milk production, prevent engorgement and protect milk supply. Guidelines for supplementing with BF given to and reviewed with Mom. Advised Mom baby should be at breast 8-12 times in 24 hours and with feeding ques. Mom prefers to use bottle to supplement. Lactation brochure left for review, advised of OP services and support group. Encouraged to call for assist if desired.   Maternal Data Has patient been taught Hand Expression?: Yes (Mom reports instructed by RN) Does the patient have breastfeeding experience prior to this delivery?: Yes  Feeding    LATCH Score/Interventions                      Lactation Tools Discussed/Used WIC Program: No   Consult Status Consult Status: PRN    Katrine Coho 05/02/2015, 5:12 PM

## 2015-05-02 NOTE — MAU Note (Signed)
Contractions, pressure

## 2015-05-02 NOTE — H&P (Signed)
Savannah Thomas is a 35 y.o. female [phone] @ 39.4 wks estimated gestational age (as dated by a 11-week ultrasound) presents complaining of painful contractions every 2 minutes that started this evening. Endorses FM. No leaking, bleeding, h/a, visual changes, epigastric pain or difficulty breathing.  Pregnancy c/b: 1) TOLAC - successful VBAC x 4 2) Morbid obesity 3) Anemia  Ultrasounds: Growth ultrasounds every 4 wks due to inability to adequately measure Dating at 11 wks --- EDD 05/05/15 20-wk scan - transverse/anterior/50% limited anatomy due to habitus 30.6 wks - transverse/EFW 3lbs 10 oz (54th%tile) 35 wks - vtx/anterior/EFW 5+11 (66th%tile)  History OB History    Gravida Para Term Preterm AB TAB SAB Ectopic Multiple Living   6 6 5 1  0 0 0  0 6    10/2004 @ 40 wks, labor at least 12 hrs before c-section. C-section performed in [place]. Female infant, birthwt 8+5 SVB 01/2006 @ 35 wks, labor at least 12 hrs. Female infant, birthwt 5+15. Delivery at [facility]. Received progesterone injections for PTL SVB 12/2007 @ 40 wks, labor less than 12 hrs. Female infant, birthwt 6+7. Delivery at [facility] 07/2010 @ 39 wks, labor less than 12 hrs. Female infant, birthwt 7+6 - received epidural. IOL due to Hypertension SVB 07/22/13 @ 40 wks, labor less than 1 hr. Female infant, birthwt 7+7. Delivery at [facility]  Past Medical History  Diagnosis Date  . Abnormal Pap smear   . Preterm labor   . Fibroids   . Chlamydia   . Candidiasis   . Asthma   . Migraines   . Hypertension    Past Surgical History  Procedure Laterality Date  . Dilation and curettage, diagnostic / therapeutic      TAB  . Dilation and curettage of uterus    . Cesarean section     Family History: family history includes Cancer in her maternal grandmother; Heart disease in her cousin and paternal grandfather; Hypertension in her father, maternal grandfather, maternal grandmother, mother, paternal grandfather, and paternal grandmother.Sickle cell  trait in her spouse and 2 of her children, Kidney disease in her PGGM, DM in her MGM and PGM, Breast disease in her MGM, 2 maternal aunts and maternal 1st cousin. Social History:  reports that she has never smoked. She has never used smokeless tobacco. She reports that she does not drink alcohol or use illicit drugs. Pt is African American. FOB Nobie Putnam) present and supportive.   Prenatal Transfer Tool  Maternal Diabetes: No Genetic Screening: Unknown Maternal Ultrasounds/Referrals: Normal Fetal Ultrasounds or other Referrals:  None Maternal Substance Abuse:  No Significant Maternal Medications:  Meds include: Other: Multivitamin, Vitamin D, Vitamin B-12, Integra, Ferrous Sulfate Significant Maternal Lab Results:  Lab values include: Group B Strep positive Other Comments:  None  ROS 10 Systems reviewed and are negative for acute change except as noted in the HPI.  Dilation: 3.5 Effacement (%): 90 Station: -2 Exam by:: [place],RN with BBOW Blood pressure 121/76, pulse 75, temperature 97.4 F (36.3 C), temperature source Oral, resp. rate 18, height 5' 7.5" (1.715 m), weight 145.151 kg (320 lb), SpO2 100 %, unknown if currently breastfeeding. Maternal Exam:  Uterine Assessment: Contraction strength is firm.  Contraction frequency is regular.   Abdomen: Patient reports no abdominal tenderness. Surgical scars: low transverse.   Fundal height is CWD.   Estimated fetal weight is 8 lbs.   Fetal presentation: vertex  Introitus: Normal vulva. Normal vagina.  Ferning test: not done.  Amniotic fluid character: not assessed.  Pelvis: adequate for delivery.   Cervix: Cervix evaluated by digital exam.     Fetal Exam Fetal Monitor Review: Mode: fetoscope.   Baseline rate: 140.  Variability: moderate (6-25 bpm).   Pattern: accelerations present.    Fetal State Assessment: Category I - tracings are normal.     Physical Exam  Nursing note and vitals reviewed. Constitutional: She  is oriented to person, place, and time. She appears well-developed and well-nourished. She appears distressed.  HENT:  Head: Normocephalic and atraumatic.  Cardiovascular: Normal rate and regular rhythm.  Exam reveals no gallop and no friction rub.   No murmur heard. Respiratory: Effort normal and breath sounds normal.  GI: Soft. There is no rebound and no guarding.  Genitourinary: Vagina normal and uterus normal.  Musculoskeletal: Normal range of motion. She exhibits no edema.  Neurological: She is alert and oriented to person, place, and time.  Skin: Skin is warm and dry.    Prenatal labs: ABO, Rh: --/--/A POS (04/02 0355) Antibody: NEG (04/02 0355) Rubella: Immune (10/28 0000) RPR: Non Reactive (04/02 0355)  HBsAg: Negative (10/28 0000)  HIV: Non-reactive (10/28 0000)  GBS: Positive (04/07/2015) NOB Hbg 12.0, 10.0 at 28 wks  Results for orders placed or performed during the hospital encounter of 05/02/15 (from the past 24 hour(s))  CBC     Status: Abnormal   Collection Time: 05/02/15  3:55 AM  Result Value Ref Range   WBC 11.5 (H) 4.0 - 10.5 K/uL   RBC 4.18 3.87 - 5.11 MIL/uL   Hemoglobin 9.5 (L) 12.0 - 15.0 g/dL   HCT 30.6 (L) 36.0 - 46.0 %   MCV 73.2 (L) 78.0 - 100.0 fL   MCH 22.7 (L) 26.0 - 34.0 pg   MCHC 31.0 30.0 - 36.0 g/dL   RDW 17.1 (H) 11.5 - 15.5 %   Platelets 293 150 - 400 K/uL  Type and screen [place]     Status: None   Collection Time: 05/02/15  3:55 AM  Result Value Ref Range   ABO/RH(D) A POS    Antibody Screen NEG    Sample Expiration 05/05/2015   RPR     Status: None   Collection Time: 05/02/15  3:55 AM  Result Value Ref Range   RPR Ser Ql Non Reactive Non Reactive  Comprehensive metabolic panel     Status: Abnormal   Collection Time: 05/02/15  3:55 AM  Result Value Ref Range   Sodium 132 (L) 135 - 145 mmol/L   Potassium 3.8 3.5 - 5.1 mmol/L   Chloride 104 101 - 111 mmol/L   CO2 21 (L) 22 - 32 mmol/L   Glucose, Bld 106 (H)  65 - 99 mg/dL   BUN 5 (L) 6 - 20 mg/dL   Creatinine, Ser 0.55 0.44 - 1.00 mg/dL   Calcium 8.6 (L) 8.9 - 10.3 mg/dL   Total Protein 6.9 6.5 - 8.1 g/dL   Albumin 2.7 (L) 3.5 - 5.0 g/dL   AST 22 15 - 41 U/L   ALT 11 (L) 14 - 54 U/L   Alkaline Phosphatase 122 38 - 126 U/L   Total Bilirubin 0.4 0.3 - 1.2 mg/dL   GFR calc non Af Amer >60 >60 mL/min   GFR calc Af Amer >60 >60 mL/min   Anion gap 7 5 - 15  Lactate dehydrogenase     Status: None   Collection Time: 05/02/15  3:55 AM  Result Value Ref Range   LDH 183 98 -  192 U/L  Uric acid     Status: None   Collection Time: 05/02/15  3:55 AM  Result Value Ref Range   Uric Acid, Serum 4.9 2.3 - 6.6 mg/dL   Assessment: IUP @ 39.4 wks Active labor TOLAC - successful VBAC x 4 FWB - Cat I Elevated BP, suspect due to pain  GBS positive   Plan: preE labs with admission labs Ampicillin for GBS prophylaxis Epidural upon request Expect SVD   Farrel Gordon 05/02/2015, 04:00 AM   ADDENDUM: [place] shortly after sitting up for epidural, clear fluid noted. Baby crowning at that point; pt screaming out in pain. She went on to deliver precipitously. See delivery summary for details. Antibiotics not given for GBS prophylaxis. preE labs normal.   Farrel Gordon, CNM 05/02/15, 05:00 AM

## 2015-05-02 NOTE — Anesthesia Procedure Notes (Signed)
Epidural Patient location during procedure: OB Start time: 05/02/2015 4:32 AM  Staffing Anesthesiologist: Josephine Igo  Preanesthetic Checklist Completed: patient identified, site marked, surgical consent, pre-op evaluation, timeout performed, IV checked, risks and benefits discussed and monitors and equipment checked  Epidural Patient position: sitting Prep: site prepped and draped and DuraPrep Patient monitoring: continuous pulse ox and blood pressure Approach: midline Location: L4-L5 Injection technique: LOR air  Needle:  Needle type: Tuohy  Needle gauge: 17 G Needle length: 9 cm and 9 Needle insertion depth: 7 cm Catheter type: closed end flexible Catheter size: 19 Gauge Catheter at skin depth: 12 cm Test dose: negative and Other  Assessment Events: blood not aspirated, injection not painful, no injection resistance, negative IV test and no paresthesia  Additional Notes Patient identified. Risks and benefits discussed including failed block, incomplete  Pain control, post dural puncture headache, nerve damage, paralysis, blood pressure Changes, nausea, vomiting, reactions to medications-both toxic and allergic and post Partum back pain. All questions were answered. Patient expressed understanding and wished to proceed. Sterile technique was used throughout procedure. Epidural site was Dressed with sterile barrier dressing. No paresthesias, signs of intravascular injection Or signs of intrathecal spread were encountered.  BOW ruptured while doing procedure. Patient felt urge to push. Epidural catheter was removed after the second dose.  Please see RN's note for documentation of vital signs and FHR which are stable.

## 2015-05-02 NOTE — Anesthesia Preprocedure Evaluation (Addendum)
Anesthesia Evaluation  Patient identified by MRN, date of birth, ID band Patient awake    Reviewed: Allergy & Precautions, Patient's Chart, lab work & pertinent test results  Airway Mallampati: III  TM Distance: >3 FB Neck ROM: Full    Dental no notable dental hx. (+) Teeth Intact   Pulmonary asthma ,    Pulmonary exam normal breath sounds clear to auscultation       Cardiovascular hypertension, Normal cardiovascular exam Rhythm:Regular Rate:Normal     Neuro/Psych negative neurological ROS  negative psych ROS   GI/Hepatic Neg liver ROS, GERD  Medicated and Controlled,  Endo/Other  Morbid obesity  Renal/GU negative Renal ROS  negative genitourinary   Musculoskeletal   Abdominal (+) + obese,   Peds  Hematology  (+) anemia ,   Anesthesia Other Findings   Reproductive/Obstetrics (+) Pregnancy Previous C/Section with 4 successful VBAC's                            Anesthesia Physical Anesthesia Plan  ASA: III  Anesthesia Plan: Epidural   Post-op Pain Management:    Induction:   Airway Management Planned: Natural Airway  Additional Equipment:   Intra-op Plan:   Post-operative Plan:   Informed Consent: I have reviewed the patients History and Physical, chart, labs and discussed the procedure including the risks, benefits and alternatives for the proposed anesthesia with the patient or authorized representative who has indicated his/her understanding and acceptance.     Plan Discussed with: Anesthesiologist  Anesthesia Plan Comments:         Anesthesia Quick Evaluation

## 2015-05-02 NOTE — Anesthesia Postprocedure Evaluation (Signed)
Anesthesia Post Note  Patient: Savannah Thomas  Procedure(s) Performed: * No procedures listed *  Patient location during evaluation: Mother Baby Anesthesia Type: Epidural Level of consciousness: awake and alert and oriented Pain management: satisfactory to patient Vital Signs Assessment: post-procedure vital signs reviewed and stable Respiratory status: spontaneous breathing and nonlabored ventilation Cardiovascular status: stable Postop Assessment: no headache, no backache, no signs of nausea or vomiting, adequate PO intake and patient able to bend at knees (patient up walking) Anesthetic complications: no    Last Vitals:  Filed Vitals:   05/02/15 0700 05/02/15 1230  BP: 129/70 142/71  Pulse:  83  Temp: 36.6 C 36.9 C  Resp: 16 18    Last Pain:  Filed Vitals:   05/02/15 1421  PainSc: 6                  Justn Quale

## 2015-05-03 LAB — CBC
HCT: 27.2 % — ABNORMAL LOW (ref 36.0–46.0)
Hemoglobin: 8.2 g/dL — ABNORMAL LOW (ref 12.0–15.0)
MCH: 22.2 pg — AB (ref 26.0–34.0)
MCHC: 30.1 g/dL (ref 30.0–36.0)
MCV: 73.7 fL — AB (ref 78.0–100.0)
PLATELETS: 232 10*3/uL (ref 150–400)
RBC: 3.69 MIL/uL — AB (ref 3.87–5.11)
RDW: 17.2 % — AB (ref 11.5–15.5)
WBC: 12.8 10*3/uL — AB (ref 4.0–10.5)

## 2015-05-03 NOTE — Lactation Note (Signed)
This note was copied from a baby's chart. Lactation Consultation Note  Patient Name: Savannah Thomas [id number] Date: 05/03/2015 Reason for consult: Follow-up assessment   With this mom of a term baby, now 3 hours old. This is moms 6th child, and she is supplementing with formula, because " I have no milk" . I showed mom with hand expression, that she has colostrum, and that if the baby is showing hunger cues frequently, she is cluster feeding. I explained that this is normal, and by tomorrow, her milk will transition in. Mom laughed, said milk supply was not a problem for her. I encouraged her to avoid formula, and just breastfeed. Mom seemed to agree, and knows to call for questions/concerns.    Maternal Data    Feeding    LATCH Score/Interventions                      Lactation Tools Discussed/Used     Consult Status Consult Status: Complete Follow-up type: Call as needed    Tonna Corner 05/03/2015, 2:11 PM

## 2015-05-03 NOTE — Progress Notes (Signed)
Post Partum Day 1 SVD Subjective: no complaints, up ad lib, voiding and tolerating PO  Objective: Blood pressure 153/86, pulse 63, temperature 98.1 F (36.7 C), temperature source Oral, resp. rate 18, height 5' 7.5" (1.715 m), weight 320 lb (145.151 kg), SpO2 100 %, unknown if currently breastfeeding.  Physical Exam:  General: alert and cooperative Lochia: appropriate Uterine Fundus: firm Incision: NA DVT Evaluation: No evidence of DVT seen on physical exam.   Recent Labs  05/02/15 0355 05/03/15 0514  HGB 9.5* 8.2*  HCT 30.6* 27.2*    Assessment/Plan: Plan for discharge tomorrow and Breastfeeding  Pt interested in tubal ligation outpatient later vs IUD vs Essure Pt will discuss further with Dr. Nelda Marseille Routine postpartum care    LOS: 1 day   Darek Eifler J. 05/03/2015, 8:16 AM

## 2015-05-04 MED ORDER — FERROUS SULFATE 325 (65 FE) MG PO TABS: 325.0000 mg | ORAL_TABLET | Freq: Every day | ORAL | Status: AC

## 2015-05-04 MED ORDER — IBUPROFEN 600 MG PO TABS: 600.0000 mg | ORAL_TABLET | Freq: Four times a day (QID) | ORAL | Status: AC

## 2015-05-04 MED ORDER — DOCUSATE SODIUM 100 MG PO CAPS: 100.0000 mg | ORAL_CAPSULE | Freq: Two times a day (BID) | ORAL | Status: AC

## 2015-05-04 NOTE — Discharge Instructions (Signed)
Vaginal Delivery, Care After Refer to this sheet in the next few weeks. These discharge instructions provide you with information on caring for yourself after delivery. Your health care provider may also give you specific instructions. Your treatment has been planned according to the most current medical practices available, but problems sometimes occur. Call your health care provider if you have any problems or questions after you go home. HOME CARE INSTRUCTIONS  Take over-the-counter or prescription medicines only as directed by your health care provider or pharmacist.  Please take iron once daily.  This may cause constipation, which may require you to take colace 1-2x per day as needed.  For pain management you may take Ibuprofen or over the counter tylenol.  Do not drink alcohol, especially if you are breastfeeding or taking medicine to relieve pain.  Do not chew or smoke tobacco.  Do not use illegal drugs.  Continue to use good perineal care. Good perineal care includes:  Wiping your perineum from front to back.  Keeping your perineum clean.  Do not use tampons or douche until your health care provider says it is okay.  Shower, wash your hair, and take tub baths as directed by your health care provider.  Wear a well-fitting bra that provides breast support.  Eat healthy foods.  Drink enough fluids to keep your urine clear or pale yellow.  Eat high-fiber foods such as whole grain cereals and breads, brown rice, beans, and fresh fruits and vegetables every day. These foods may help prevent or relieve constipation.  Follow your health care provider's recommendations regarding resumption of activities such as climbing stairs, driving, lifting, exercising, or traveling.  Talk to your health care provider about resuming sexual activities. Resumption of sexual activities is dependent upon your risk of infection, your rate of healing, and your comfort and desire to resume sexual  activity.  Try to have someone help you with your household activities and your newborn for at least a few days after you leave the hospital.  Rest as much as possible. Try to rest or take a nap when your newborn is sleeping.  Increase your activities gradually.  Keep all of your scheduled postpartum appointments. It is very important to keep your scheduled follow-up appointments. At these appointments, your health care provider will be checking to make sure that you are healing physically and emotionally. SEEK MEDICAL CARE IF:   You are passing large clots from your vagina. Save any clots to show your health care provider.  You have a foul smelling discharge from your vagina.  You have trouble urinating.  You are urinating frequently.  You have pain when you urinate.  You have a change in your bowel movements.  You have increasing redness, pain, or swelling near your vaginal incision (episiotomy) or vaginal tear.  You have pus draining from your episiotomy or vaginal tear.  Your episiotomy or vaginal tear is separating.  You have painful, hard, or reddened breasts.  You have a severe headache.  You have blurred vision or see spots.  You feel sad or depressed.  You have thoughts of hurting yourself or your newborn.  You have questions about your care, the care of your newborn, or medicines.  You are dizzy or light-headed.  You have a rash.  You have nausea or vomiting.  You were breastfeeding and have not had a menstrual period within 12 weeks after you stopped breastfeeding.  You are not breastfeeding and have not had a menstrual period by the  12th week after delivery.  You have a fever. SEEK IMMEDIATE MEDICAL CARE IF:   You have persistent pain.  You have chest pain.  You have shortness of breath.  You faint.  You have leg pain.  You have stomach pain.  Your vaginal bleeding saturates two or more sanitary pads in 1 hour.   This information is not  intended to replace advice given to you by your health care provider. Make sure you discuss any questions you have with your health care provider.   Document Released: 01/14/2000 Document Revised: 10/07/2014 Document Reviewed: 09/13/2011 Elsevier Interactive Patient Education [employer].

## 2015-05-04 NOTE — Progress Notes (Signed)
Post Partum Day 2 SVD Subjective: Pt resting comfortably in bed, reports no acute complaints.  No F/C/SOB.  Tolerating gen diet.  +flatus, no BM.  Lochia moderate.  Voiding and ambulating without difficulty  Objective: Blood pressure 119/77, pulse 79, temperature 98.4 F (36.9 C), temperature source Oral, resp. rate 20, height 5' 7.5" (1.715 m), weight 145.151 kg (320 lb), SpO2 99 %, unknown if currently breastfeeding.  Physical Exam:  General: alert and cooperative  CV:RRR Lungs: CTAB Abd: obese, soft, non-tender Uterine Fundus: firm Incision: NA DVT Evaluation: No evidence of DVT seen on physical exam.  Minimal non-pitting edema bilaterally.   Recent Labs  05/02/15 0355 05/03/15 0514  HGB 9.5* 8.2*  HCT 30.6* 27.2*    Assessment/Plan: 35yo [id number] s/p NSVD, PPD#2 Routine postpartum care  Meeting postpartum milestones appropriately plan for discharge home today Contraception: mirena as outpatient   LOS: 2 days   Janyth Pupa, M 05/04/2015, 6:34 AM

## 2015-05-05 NOTE — Discharge Summary (Signed)
OB Discharge Summary     Patient Name: Savannah Thomas DOB: 1980/02/14 MRN: [id number]  Date of admission: 05/02/2015 Delivering MD: Farrel Gordon   Date of discharge: 05/04/2015  Admitting diagnosis: 39wks, pain, pressure, cramping Intrauterine pregnancy: [redacted]w[redacted]d     Secondary diagnosis:  Principal Problem:   Vaginal delivery Active Problems:   History of VBAC (x4)   Anemia affecting pregnancy   Morbidly obese ([place])   Asthma   Hx of migraines  Additional problems: none     Discharge diagnosis: Term Pregnancy Delivered, VBAC, Anemia and obesity                                                                                                Post partum procedures:none  Augmentation: none  Complications: None  Hospital course:  Onset of Labor With Vaginal Delivery     35 y.o. yo [id number] at [redacted]w[redacted]d was admitted in Active Labor on 05/02/2015. Patient had an uncomplicated labor course as follows:  Membrane Rupture Time/Date: 4:37 AM ,05/02/2015   Intrapartum Procedures: Episiotomy: None [1]                                         Lacerations:  None [1]  Patient had a delivery of a Viable infant. 05/02/2015  Information for the patient's newborn:  Hendel, Pamperin [id number]  Delivery Method: Vag-Spont    Pateint had an uncomplicated postpartum course.  She is ambulating, tolerating a regular diet, passing flatus, and urinating well. Patient is discharged home in stable condition on 05/04/2015.    Physical exam  Filed Vitals:   05/03/15 0757 05/03/15 1156 05/03/15 1850 05/04/15 0538  BP: 153/86 120/68 139/82 119/77  Pulse: 63 81 77 79  Temp: 98.1 F (36.7 C) 98.4 F (36.9 C) 98.2 F (36.8 C) 98.4 F (36.9 C)  TempSrc: Oral Oral Oral   Resp: 18 16 18 20   Height:      Weight:      SpO2: 100% 99%     General: alert, cooperative and no distress Lochia: appropriate Uterine Fundus: firm Incision: N/A DVT Evaluation: No evidence of DVT seen on physical  exam. Labs: Lab Results  Component Value Date   WBC 12.8* 05/03/2015   HGB 8.2* 05/03/2015   HCT 27.2* 05/03/2015   MCV 73.7* 05/03/2015   PLT 232 05/03/2015   CMP Latest Ref Rng 05/02/2015  Glucose 65 - 99 mg/dL 106(H)  BUN 6 - 20 mg/dL 5(L)  Creatinine 0.44 - 1.00 mg/dL 0.55  Sodium 135 - 145 mmol/L 132(L)  Potassium 3.5 - 5.1 mmol/L 3.8  Chloride 101 - 111 mmol/L 104  CO2 22 - 32 mmol/L 21(L)  Calcium 8.9 - 10.3 mg/dL 8.6(L)  Total Protein 6.5 - 8.1 g/dL 6.9  Total Bilirubin 0.3 - 1.2 mg/dL 0.4  Alkaline Phos 38 - 126 U/L 122  AST 15 - 41 U/L 22  ALT 14 - 54 U/L 11(L)    Discharge instruction: per After Visit Summary and "Baby  and Me Booklet".  After visit meds:    Medication List    TAKE these medications        docusate sodium 100 MG capsule  Commonly known as:  COLACE  Take 1 capsule (100 mg total) by mouth 2 (two) times daily.     ferrous sulfate 325 (65 FE) MG tablet  Take 1 tablet (325 mg total) by mouth daily with breakfast.     ibuprofen 600 MG tablet  Commonly known as:  ADVIL,MOTRIN  Take 1 tablet (600 mg total) by mouth every 6 (six) hours.     prenatal multivitamin Tabs tablet  Take 1 tablet by mouth daily at 12 noon.        Diet: routine diet  Activity: Advance as tolerated. Pelvic rest for 6 weeks.   Outpatient follow up:6 weeks Follow up Appt:No future appointments. Follow up Visit:No Follow-up on file.  Postpartum contraception: IUD Mirena  Newborn Data: Live born female  Birth Weight: 8 lb 2 oz (3685 g) APGAR: 8, 9  Baby Feeding: Breast Disposition:home with mother   05/05/2015 Janyth Pupa, M, DO

## 2015-06-25 ENCOUNTER — Other Ambulatory Visit (HOSPITAL_COMMUNITY)
Admission: RE | Admit: 2015-06-25 | Discharge: 2015-06-25 | Disposition: A | Discharge status: Home / Self Care | Payer: PRIVATE HEALTH INSURANCE | Source: Ambulatory Visit | Attending: Obstetrics and Gynecology | Admitting: Obstetrics and Gynecology

## 2015-06-25 ENCOUNTER — Other Ambulatory Visit: Payer: Self-pay | Admitting: Obstetrics & Gynecology

## 2015-06-25 DIAGNOSIS — Z01419 Encounter for gynecological examination (general) (routine) without abnormal findings: Secondary | ICD-10-CM | POA: Diagnosis not present

## 2015-06-29 LAB — CYTOLOGY - PAP

## 2015-09-13 ENCOUNTER — Encounter: Payer: Self-pay | Admitting: Emergency Medicine

## 2015-09-13 ENCOUNTER — Emergency Department (HOSPITAL_COMMUNITY)
Admission: EM | Admit: 2015-09-13 | Discharge: 2015-09-14 | Disposition: A | Discharge status: Home / Self Care | Payer: PRIVATE HEALTH INSURANCE | Attending: Emergency Medicine | Admitting: Emergency Medicine

## 2015-09-13 ENCOUNTER — Emergency Department (HOSPITAL_COMMUNITY): Payer: PRIVATE HEALTH INSURANCE

## 2015-09-13 DIAGNOSIS — J45909 Unspecified asthma, uncomplicated: Secondary | ICD-10-CM | POA: Insufficient documentation

## 2015-09-13 DIAGNOSIS — Z79899 Other long term (current) drug therapy: Secondary | ICD-10-CM | POA: Insufficient documentation

## 2015-09-13 DIAGNOSIS — Y929 Unspecified place or not applicable: Secondary | ICD-10-CM | POA: Insufficient documentation

## 2015-09-13 DIAGNOSIS — Z791 Long term (current) use of non-steroidal anti-inflammatories (NSAID): Secondary | ICD-10-CM | POA: Diagnosis not present

## 2015-09-13 DIAGNOSIS — S8000XA Contusion of unspecified knee, initial encounter: Secondary | ICD-10-CM

## 2015-09-13 DIAGNOSIS — W0110XA Fall on same level from slipping, tripping and stumbling with subsequent striking against unspecified object, initial encounter: Secondary | ICD-10-CM | POA: Diagnosis not present

## 2015-09-13 DIAGNOSIS — Y999 Unspecified external cause status: Secondary | ICD-10-CM | POA: Diagnosis not present

## 2015-09-13 DIAGNOSIS — I10 Essential (primary) hypertension: Secondary | ICD-10-CM | POA: Diagnosis not present

## 2015-09-13 DIAGNOSIS — M25512 Pain in left shoulder: Secondary | ICD-10-CM | POA: Diagnosis present

## 2015-09-13 DIAGNOSIS — Y939 Activity, unspecified: Secondary | ICD-10-CM | POA: Insufficient documentation

## 2015-09-13 DIAGNOSIS — M7502 Adhesive capsulitis of left shoulder: Secondary | ICD-10-CM | POA: Diagnosis not present

## 2015-09-13 NOTE — ED Triage Notes (Signed)
Pt states that 2.5 weeks ago she slipped and fell, landing on her L shoulder and L knee which are both hurting her at this time. Taking meloxicam at home w/o relief. Alert and oriented.

## 2015-09-13 NOTE — ED Notes (Signed)
Pt called for room, no answer.

## 2015-09-14 MED ORDER — DIAZEPAM 2 MG PO TABS: 2.0000 mg | ORAL_TABLET | Freq: Four times a day (QID) | ORAL | 0 refills | Status: AC | PRN

## 2015-09-14 NOTE — ED Provider Notes (Signed)
[facility] DEPT Provider Note   CSN: [id number] Arrival date & time: 09/13/15  2022  By signing my name below, I, Gwenlyn Fudge, attest that this documentation has been prepared under the direction and in the presence of [employer], PA-C. Electronically Signed: Gwenlyn Fudge, ED Scribe. 09/14/15. 1:51 AM.  History   Chief Complaint Chief Complaint  Patient presents with  . Fall   The history is provided by the patient. No language interpreter was used.    HPI Comments: Savannah Thomas is a 35 y.o. female who presents to the Emergency Department complaining of constant left shoulder pain s/p fall. Pt states she fell 2 weeks ago. Pt states she struck her left shoulder and knee during the fall and has had a throbbing sensation. Pt reports L shoulder pain is exacerbated with movement. She states she started to restrict movement of her shoulder after her fall secondary to pain. Pt states she has been taking Meloxicam at home with no relief to her pain. No hx of LOC with fall.   Past Medical History:  Diagnosis Date  . Abnormal Pap smear   . Asthma   . Candidiasis   . Chlamydia   . Fibroids   . Hypertension   . Migraines   . Preterm labor     Patient Active Problem List   Diagnosis Date Noted  . Vaginal delivery 05/02/2015  . History of VBAC (x4) 05/02/2015  . Anemia affecting pregnancy 05/02/2015  . Morbidly obese ([place]) 05/02/2015  . Asthma 05/02/2015  . Hx of migraines 05/02/2015    Past Surgical History:  Procedure Laterality Date  . CESAREAN SECTION    . DILATION AND CURETTAGE OF UTERUS    . DILATION AND CURETTAGE, DIAGNOSTIC / THERAPEUTIC     TAB    OB History    Gravida Para Term Preterm AB Living   6 6 5 1  0 6   SAB TAB Ectopic Multiple Live Births   0 0   0 6       Home Medications    Prior to Admission medications   Medication Sig Start Date End Date Taking? Authorizing Provider  diazepam (VALIUM) 2 MG tablet Take 1 tablet (2 mg total) by mouth  every 6 (six) hours as needed for muscle spasms. 09/14/15   Antonietta Breach, PA-C  docusate sodium (COLACE) 100 MG capsule Take 1 capsule (100 mg total) by mouth 2 (two) times daily. 05/04/15   Janyth Pupa, DO  ferrous sulfate 325 (65 FE) MG tablet Take 1 tablet (325 mg total) by mouth daily with breakfast. 05/04/15   Janyth Pupa, DO  ibuprofen (ADVIL,MOTRIN) 600 MG tablet Take 1 tablet (600 mg total) by mouth every 6 (six) hours. 05/04/15   Janyth Pupa, DO  Prenatal Vit-Fe Fumarate-FA (PRENATAL MULTIVITAMIN) TABS tablet Take 1 tablet by mouth daily at 12 noon.     Historical Provider, MD    Family History Family History  Problem Relation Age of Onset  . Hypertension Mother   . Hypertension Father   . Cancer Maternal Grandmother   . Hypertension Maternal Grandmother   . Hypertension Maternal Grandfather   . Hypertension Paternal Grandmother   . Heart disease Paternal Grandfather   . Hypertension Paternal Grandfather   . Heart disease Cousin     Social History Social History  Substance Use Topics  . Smoking status: Never Smoker  . Smokeless tobacco: Never Used  . Alcohol use No     Allergies   Review of  patient's allergies indicates no known allergies.   Review of Systems Review of Systems 10 Systems reviewed and are negative for acute change except as noted in the HPI.  Physical Exam Updated Vital Signs BP 139/99 (BP Location: Left Arm)   Pulse 88   Temp 99.1 F (37.3 C) (Oral)   Resp 19   LMP 07/14/2015 (Approximate)   SpO2 98%   Physical Exam  Constitutional: She is oriented to person, place, and time. She appears well-developed and well-nourished. No distress.  Nontoxic appearing and in no distress  HENT:  Head: Normocephalic and atraumatic.  Eyes: Conjunctivae and EOM are normal. No scleral icterus.  Neck: Normal range of motion.  Cardiovascular: Normal rate, regular rhythm and intact distal pulses.   Distal radial pulse 2+ in the left upper extremity    Pulmonary/Chest: Effort normal. No respiratory distress.  Respirations even and unlabored  Musculoskeletal:       Left shoulder: She exhibits decreased range of motion (limited ROM with abduction past 60 degrees), tenderness and pain. She exhibits no bony tenderness, no deformity, normal pulse and normal strength.       Left knee: Normal.  Neurological: She is alert and oriented to person, place, and time.  Sensation to light touch intact in all extremities. Patient ambulatory with steady gait. Grip strength 5/5 in the left upper extremity.  Skin: Skin is warm and dry. No rash noted. She is not diaphoretic. No erythema. No pallor.  Psychiatric: She has a normal mood and affect. Her behavior is normal.  Nursing note and vitals reviewed.    ED Treatments / Results  DIAGNOSTIC STUDIES: Oxygen Saturation is 98% on RA, normal by my interpretation.    COORDINATION OF CARE: 1:51 AM Discussed treatment plan with pt at bedside which includes Rx Valium and pt agreed to plan.  Labs (all labs ordered are listed, but only abnormal results are displayed) Labs Reviewed - No data to display  EKG  EKG Interpretation None       Radiology Dg Shoulder Left  Result Date: 09/13/2015 CLINICAL DATA:  Fall 2 weeks ago. Left shoulder pain. Pain worse with movement. EXAM: LEFT SHOULDER - 2+ VIEW COMPARISON:  None. FINDINGS: There is no evidence of fracture or dislocation. There is no evidence of arthropathy or other focal bone abnormality. Soft tissues are unremarkable. IMPRESSION: Negative left shoulder radiographs. Electronically Signed   By: San Morelle M.D.   On: 09/13/2015 21:29   Dg Knee Complete 4 Views Left  Result Date: 09/13/2015 CLINICAL DATA:  Fall x2 weeks ago. Pt states she tripped and fell toward landing on her left knee and hitting her left shoulder on the sink. Left anterior knee pain since the fall. No previous left knee surgery. EXAM: LEFT KNEE - COMPLETE 4+ VIEW COMPARISON:   None. FINDINGS: No evidence of fracture, dislocation, or joint effusion. No evidence of arthropathy or other focal bone abnormality. Soft tissues are unremarkable. IMPRESSION: Negative. Electronically Signed   By: Lucienne Capers M.D.   On: 09/13/2015 21:28    Procedures Procedures (including critical care time)  Medications Ordered in ED Medications - No data to display   Initial Impression / Assessment and Plan / ED Course  I have reviewed the triage vital signs and the nursing notes.  Pertinent labs & imaging results that were available during my care of the patient were reviewed by me and considered in my medical decision making (see chart for details).  Clinical Course    35 year old  female presents to the emergency department for evaluation of injuries following a fall 2.5 weeks ago. Patient denies head trauma or loss of consciousness. She is neurovascularly intact and ambulatory. X-ray left shoulder and left knee are negative for bony deformity or fracture. Symptoms consistent with adhesive capsulitis of the left shoulder as well as contusion to the left knee. Patient referred to orthopedics. Have discussed supportive care. No indication for further emergent workup at this time. Patient discharged in satisfactory condition.   Final Clinical Impressions(s) / ED Diagnoses   Final diagnoses:  Adhesive capsulitis of left shoulder  Knee contusion, unspecified laterality, initial encounter    New Prescriptions New Prescriptions   DIAZEPAM (VALIUM) 2 MG TABLET    Take 1 tablet (2 mg total) by mouth every 6 (six) hours as needed for muscle spasms.   I personally performed the services described in this documentation, which was scribed in my presence. The recorded information has been reviewed and is accurate.       Antonietta Breach, PA-C 09/14/15 [id number]    Everlene Balls, MD 09/14/15 [phone]

## 2017-05-21 IMAGING — US US OB COMP LESS 14 WK
1 series · 13 of 28 positions shown · non-contrast
Comparison: Pelvic ultrasound performed 07/16/2013, and CT of the
abdomen and pelvis performed 01/24/2014

CLINICAL DATA: Acute onset of pelvic cramping.  Initial encounter.

EXAM:
OBSTETRIC <14 WK US AND TRANSVAGINAL OB US
TECHNIQUE: Both transabdominal and transvaginal ultrasound examinations were
performed for complete evaluation of the gestation as well as the
maternal uterus, adnexal regions, and pelvic cul-de-sac.
Transvaginal technique was performed to assess early pregnancy.

[Series 1: us ob transvaginal · 13 of 41 slices shown]
[im 2/41]
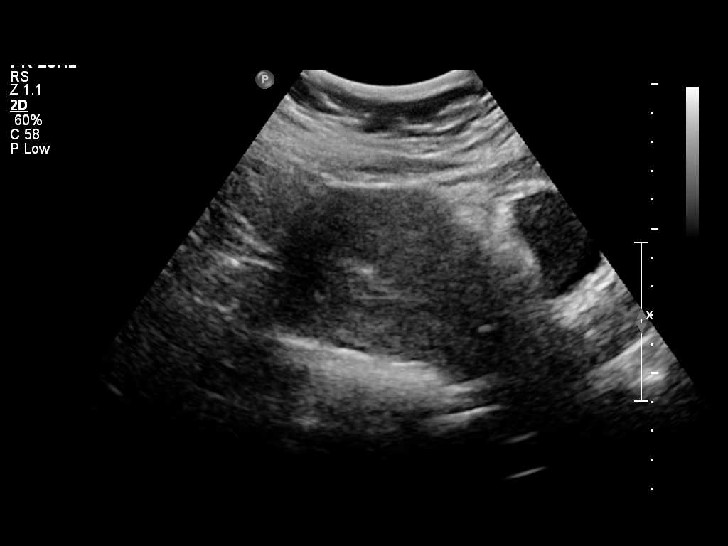
[im 5/41]
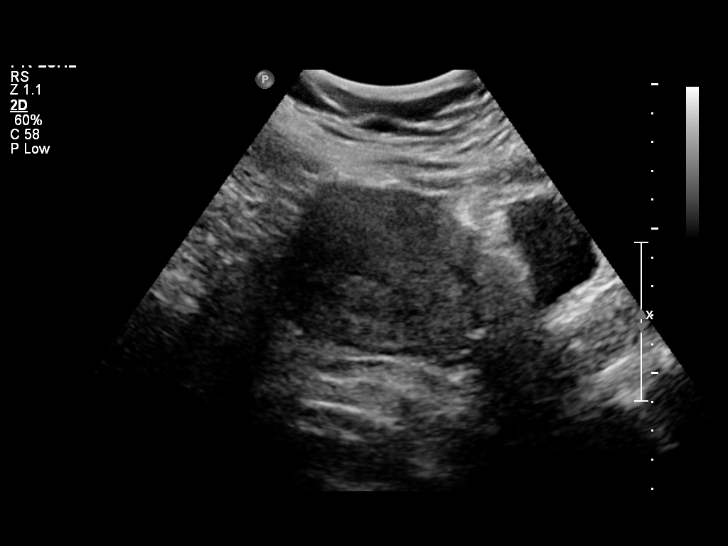
[im 8/41]
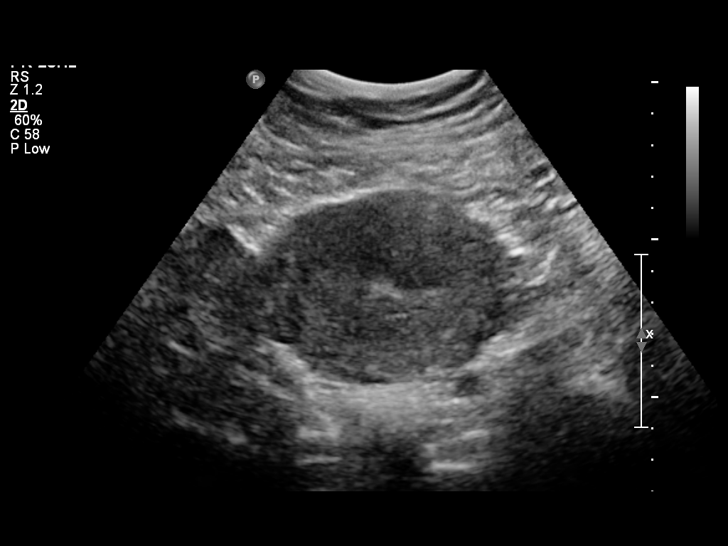
[im 11/41]
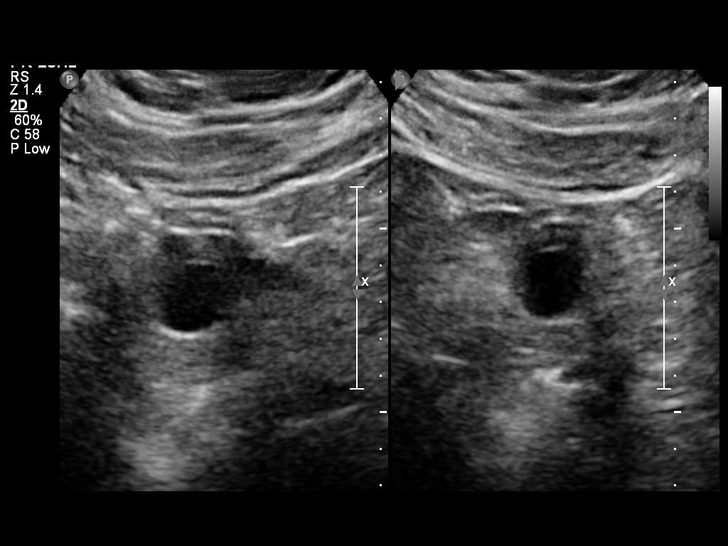
[im 14/41]
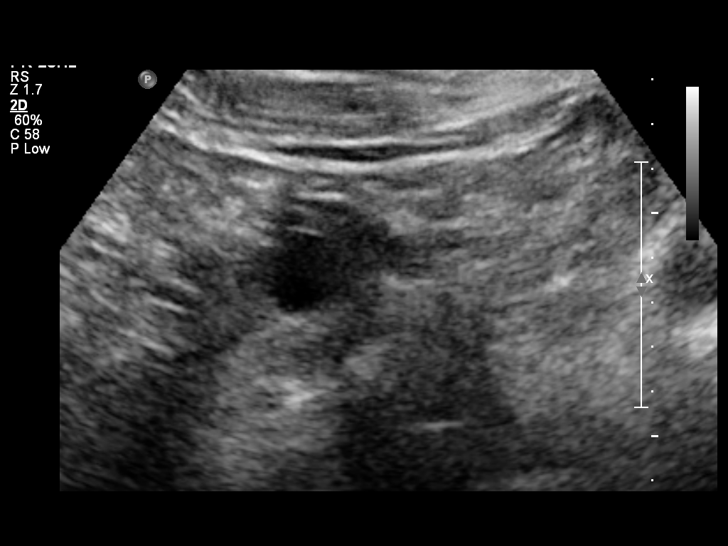
[im 17/41]
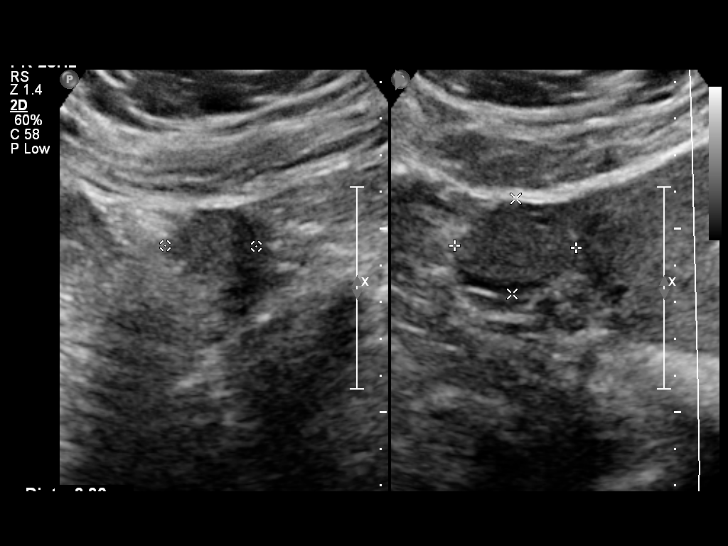
[im 21/41]
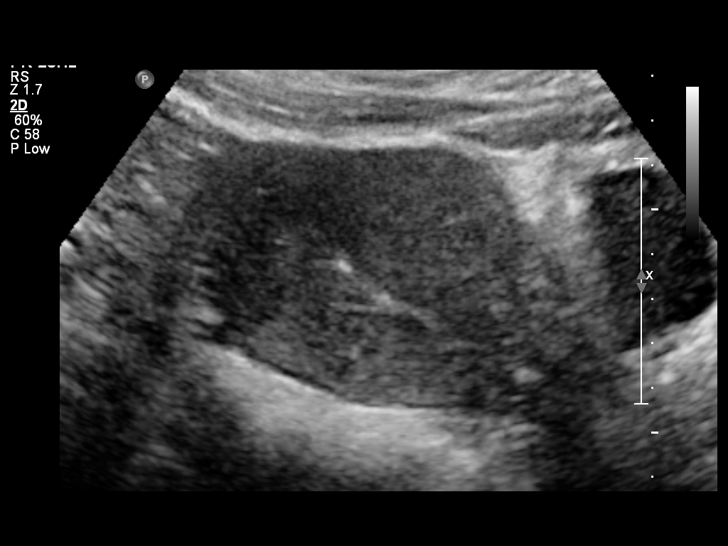
[im 24/41]
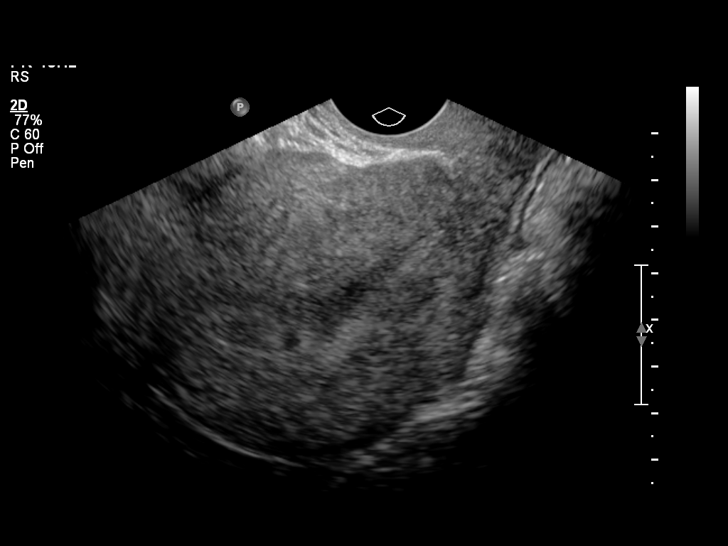
[im 27/41]
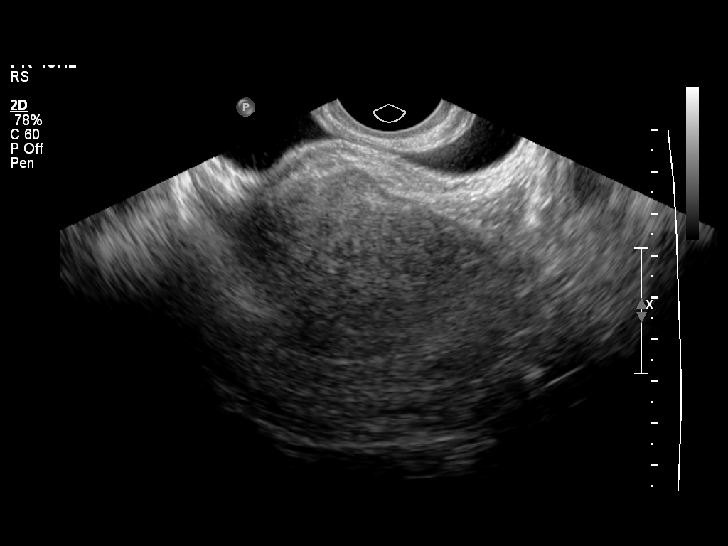
[im 30/41]
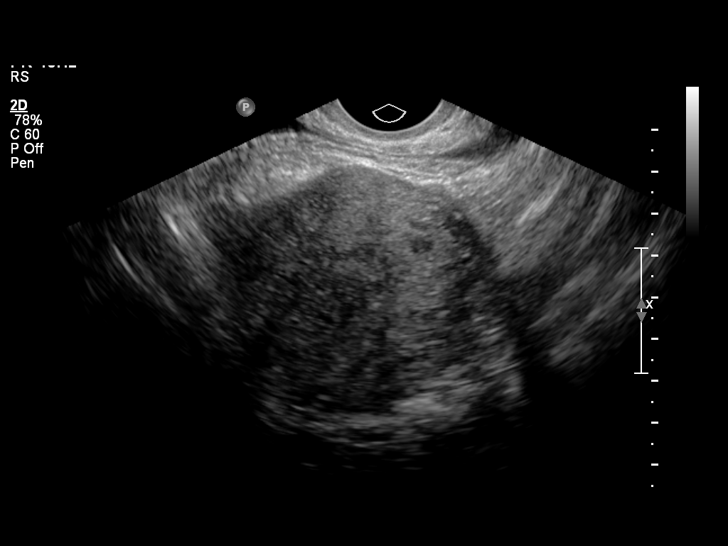
[im 33/41]
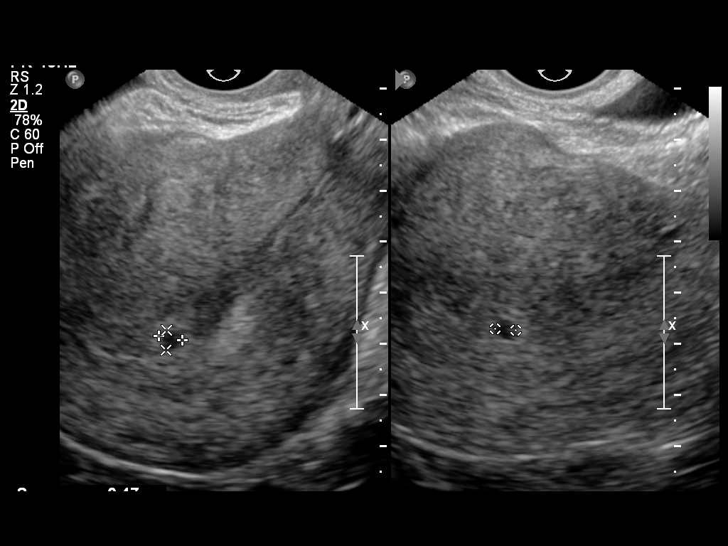
[im 36/41]
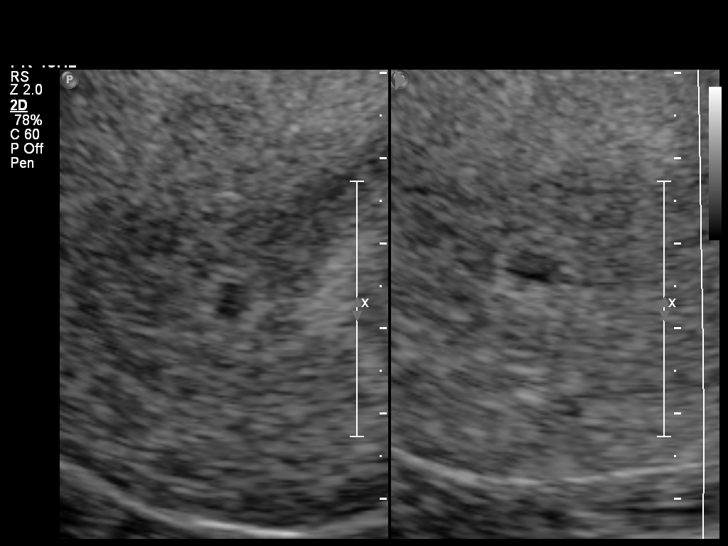
[im 39/41]
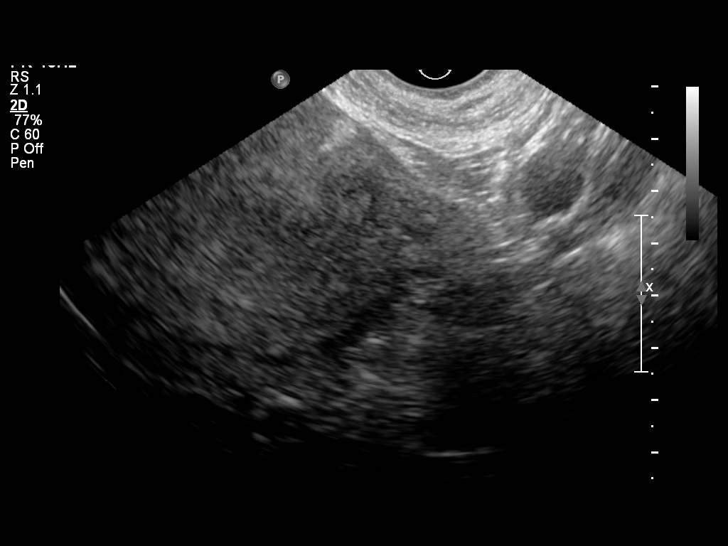

[13 of 28 positions shown; findings below may reference images not displayed]

FINDINGS: Intrauterine gestational sac: There is question of a tiny
intrauterine gestational sac.

Yolk sac:  No

Embryo:  No

Cardiac Activity: N/A

MSD: 5  mm   5 w   0  d

Maternal uterus/adnexae: No subchorionic hemorrhage is noted. The
uterus is otherwise unremarkable.

The ovaries are within normal limits. The right ovary measures 3.3 x
2.6 x 2.5 cm, while the left ovary measures 2.9 x 2.4 x 1.9 cm. No
suspicious adnexal masses are seen; there is no evidence for ovarian
torsion.

No free fluid is seen within the pelvic cul-de-sac.
IMPRESSION: Question of tiny intrauterine gestational sac, measuring 5 mm in
mean sac diameter. This corresponds to a gestational age of 5 weeks
0 days, though it remains too early to determine an estimated date
of delivery.

## 2017-05-28 IMAGING — US US OB TRANSVAGINAL
1 series · 14 of 23 positions shown · non-contrast
Comparison: 09/06/2014

CLINICAL DATA: Follow-up quantitative HCG.

EXAM:
OBSTETRIC <14 WK ULTRASOUND
TECHNIQUE: Transabdominal ultrasound was performed for evaluation of the
gestation as well as the maternal uterus and adnexal regions.

[Series 1: us ob follow up · 23 acquisitions, 14 frames shown]
[im 1/23]
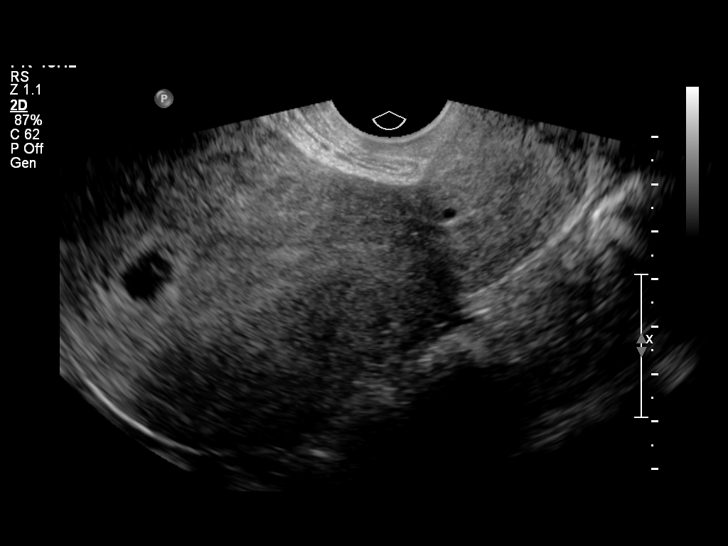
[im 3/23]
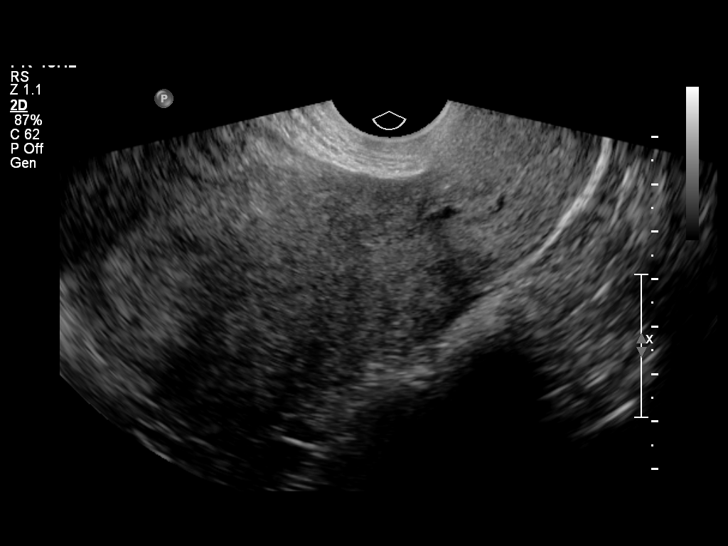
[im 5/23]
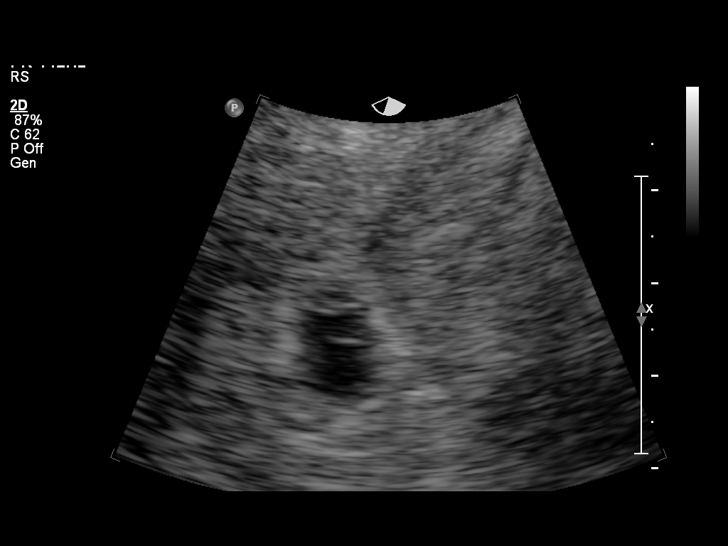
[im 6/23]
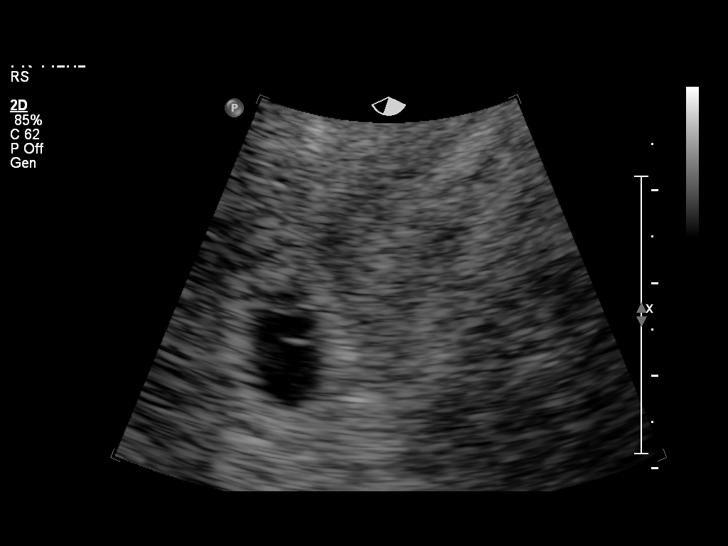
[im 8/23]
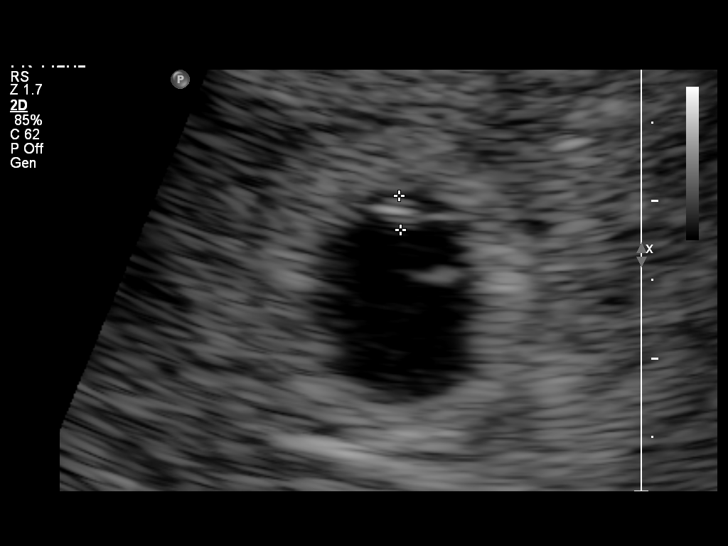
[im 10/23]
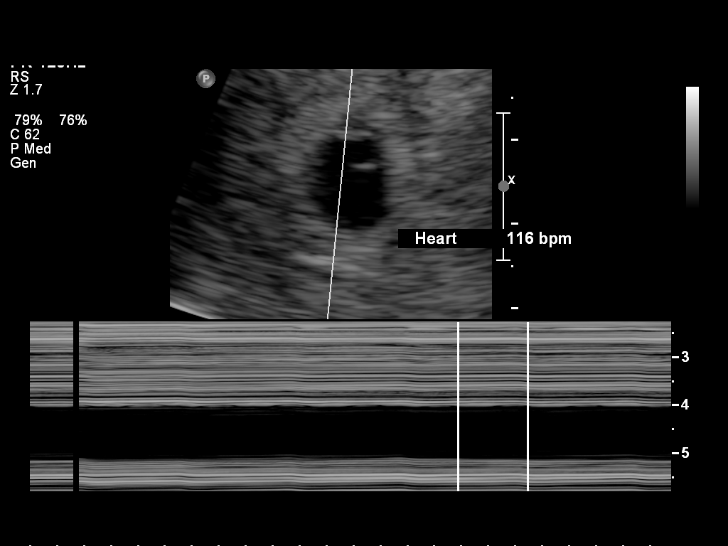
[im 11/23]
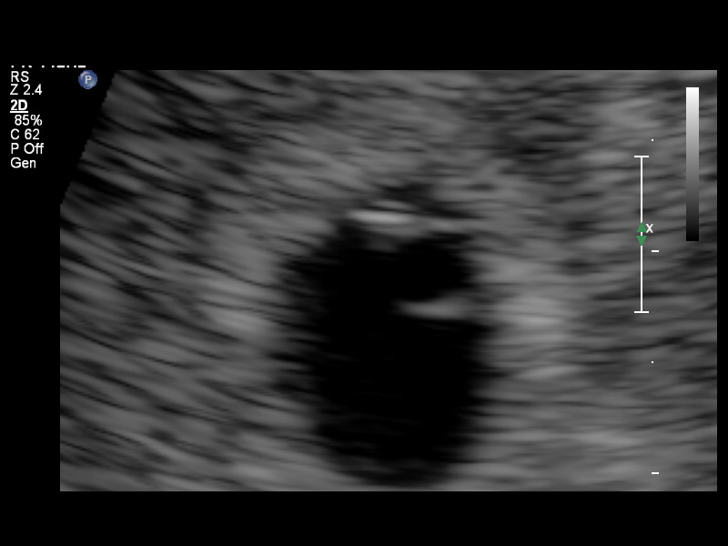
[im 13/23]
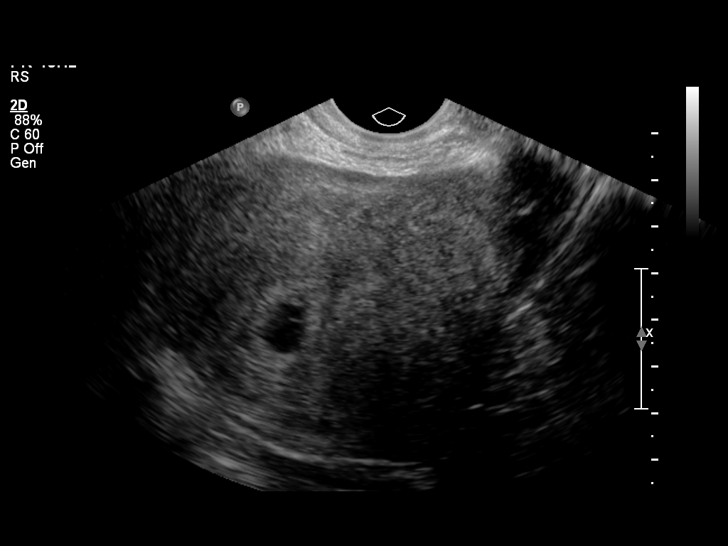
[im 14/23]
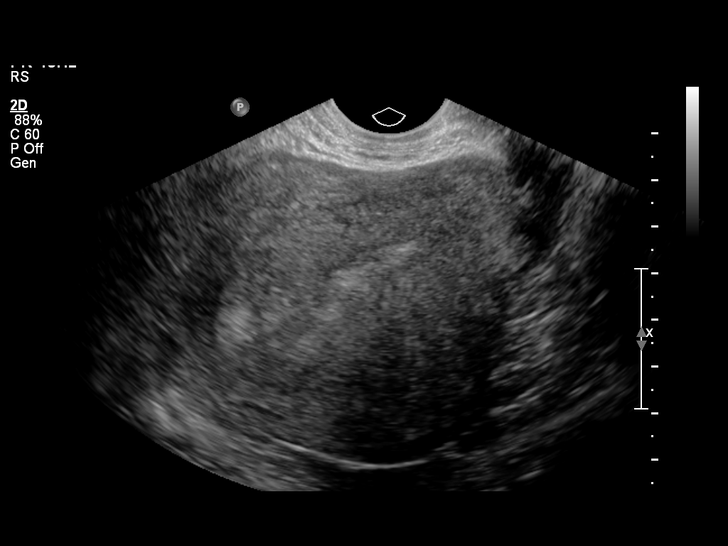
[im 16/23]
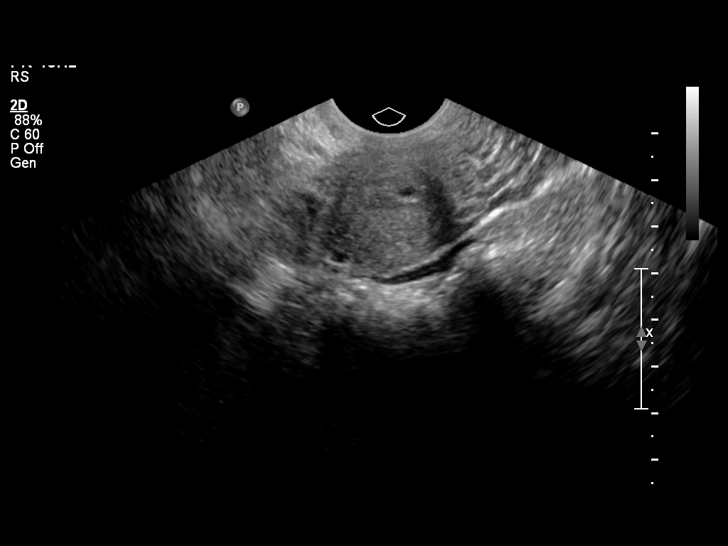
[im 18/23]
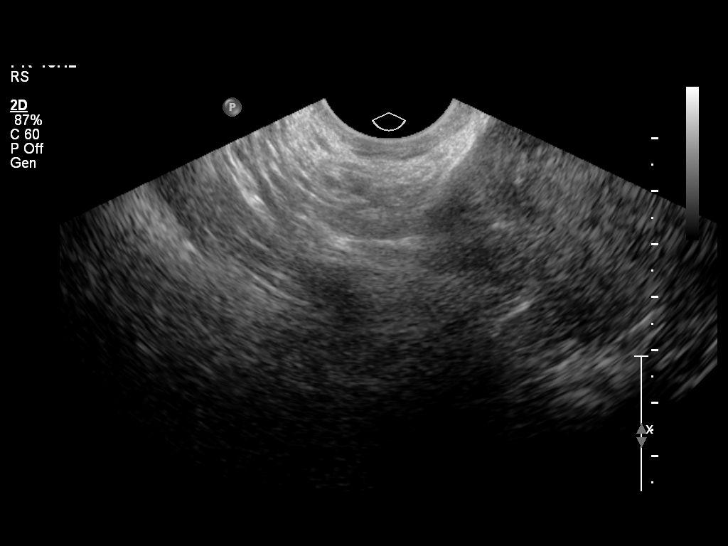
[im 19/23]
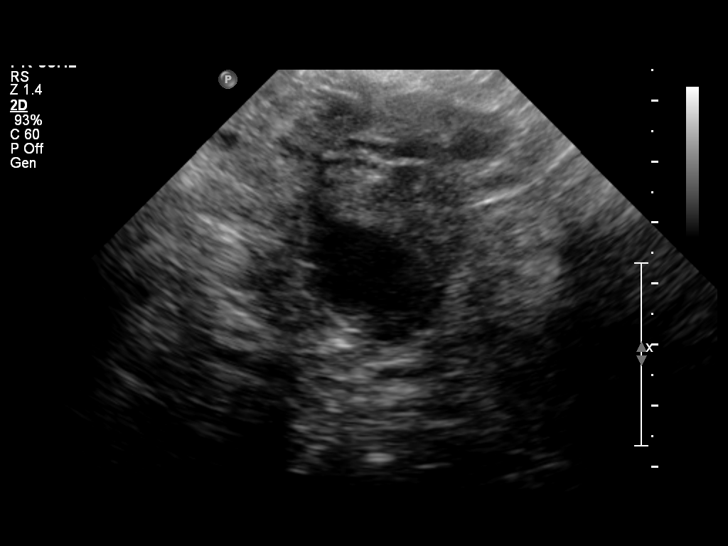
[im 21/23]
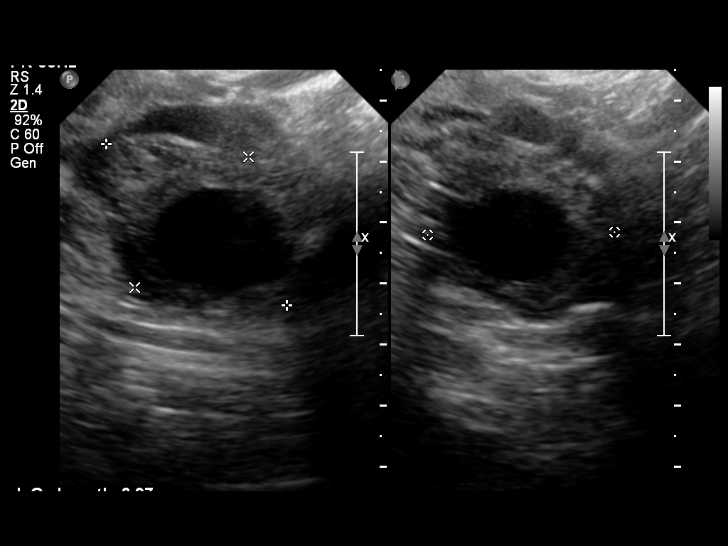
[im 23/23]
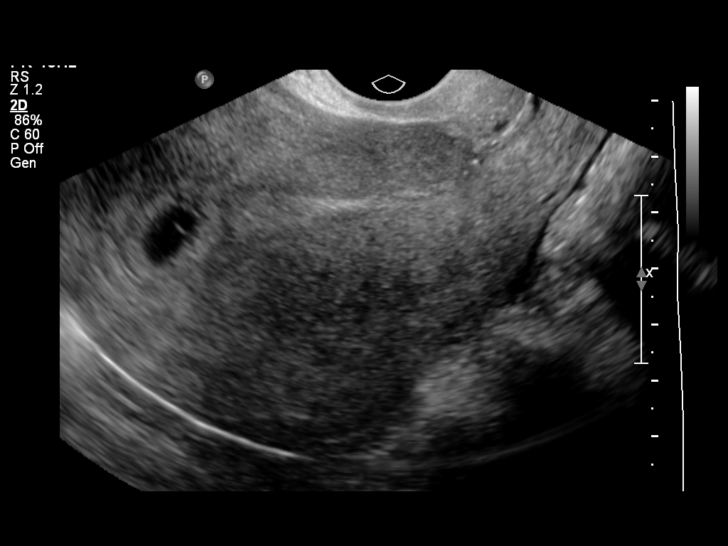

[14 of 23 positions shown; findings below may reference images not displayed]

FINDINGS: Intrauterine gestational sac: Visualized/normal in shape.

Yolk sac:  Present

Embryo:  Present

Cardiac Activity: Present

Heart Rate: 116 bpm

CRL:   2.1  mm   5 w 6 d                  US EDC: 05/10/2015

Maternal uterus/adnexae: The right ovary is not visualized. Negative
left ovary.
IMPRESSION: Single living intrauterine gestation at 5 weeks 6 days.
